# Patient Record
Sex: Male | Born: 1935 | Race: White | Hispanic: No | Smoking: Never smoker
Health system: Southern US, Community
[De-identification: ages and names within clinical notes are randomized; demographics above are authoritative.]

## PROBLEM LIST (undated history)

## (undated) DIAGNOSIS — N183 Chronic kidney disease, stage 3 unspecified: Secondary | ICD-10-CM

## (undated) DIAGNOSIS — F32A Depression, unspecified: Secondary | ICD-10-CM

## (undated) DIAGNOSIS — I10 Essential (primary) hypertension: Secondary | ICD-10-CM

## (undated) DIAGNOSIS — I509 Heart failure, unspecified: Secondary | ICD-10-CM

## (undated) DIAGNOSIS — E039 Hypothyroidism, unspecified: Secondary | ICD-10-CM

## (undated) DIAGNOSIS — E079 Disorder of thyroid, unspecified: Secondary | ICD-10-CM

## (undated) DIAGNOSIS — F329 Major depressive disorder, single episode, unspecified: Secondary | ICD-10-CM

---

## 2016-11-16 DIAGNOSIS — K219 Gastro-esophageal reflux disease without esophagitis: Secondary | ICD-10-CM | POA: Diagnosis not present

## 2016-11-16 DIAGNOSIS — R609 Edema, unspecified: Secondary | ICD-10-CM | POA: Diagnosis not present

## 2016-11-16 DIAGNOSIS — G2581 Restless legs syndrome: Secondary | ICD-10-CM | POA: Diagnosis not present

## 2016-11-16 DIAGNOSIS — E039 Hypothyroidism, unspecified: Secondary | ICD-10-CM | POA: Diagnosis not present

## 2016-11-16 DIAGNOSIS — I10 Essential (primary) hypertension: Secondary | ICD-10-CM | POA: Diagnosis not present

## 2016-11-16 DIAGNOSIS — D638 Anemia in other chronic diseases classified elsewhere: Secondary | ICD-10-CM

## 2016-11-16 DIAGNOSIS — I499 Cardiac arrhythmia, unspecified: Secondary | ICD-10-CM

## 2016-11-16 DIAGNOSIS — N189 Chronic kidney disease, unspecified: Secondary | ICD-10-CM

## 2016-11-17 DIAGNOSIS — R609 Edema, unspecified: Secondary | ICD-10-CM | POA: Diagnosis not present

## 2016-11-17 DIAGNOSIS — I10 Essential (primary) hypertension: Secondary | ICD-10-CM | POA: Diagnosis not present

## 2016-11-17 DIAGNOSIS — I499 Cardiac arrhythmia, unspecified: Secondary | ICD-10-CM | POA: Diagnosis not present

## 2016-11-17 DIAGNOSIS — D638 Anemia in other chronic diseases classified elsewhere: Secondary | ICD-10-CM | POA: Diagnosis not present

## 2016-11-17 DIAGNOSIS — G2581 Restless legs syndrome: Secondary | ICD-10-CM | POA: Diagnosis not present

## 2016-11-17 DIAGNOSIS — E039 Hypothyroidism, unspecified: Secondary | ICD-10-CM | POA: Diagnosis not present

## 2016-11-17 DIAGNOSIS — K219 Gastro-esophageal reflux disease without esophagitis: Secondary | ICD-10-CM | POA: Diagnosis not present

## 2016-11-17 DIAGNOSIS — N189 Chronic kidney disease, unspecified: Secondary | ICD-10-CM | POA: Diagnosis not present

## 2016-11-18 DIAGNOSIS — D638 Anemia in other chronic diseases classified elsewhere: Secondary | ICD-10-CM | POA: Diagnosis not present

## 2016-11-18 DIAGNOSIS — I499 Cardiac arrhythmia, unspecified: Secondary | ICD-10-CM | POA: Diagnosis not present

## 2016-11-18 DIAGNOSIS — E039 Hypothyroidism, unspecified: Secondary | ICD-10-CM | POA: Diagnosis not present

## 2016-11-18 DIAGNOSIS — N189 Chronic kidney disease, unspecified: Secondary | ICD-10-CM | POA: Diagnosis not present

## 2017-06-22 ENCOUNTER — Emergency Department (HOSPITAL_COMMUNITY): Payer: Medicare Other

## 2017-06-22 ENCOUNTER — Inpatient Hospital Stay (HOSPITAL_COMMUNITY)
Admission: EM | Admit: 2017-06-22 | Discharge: 2017-06-26 | DRG: 308 | Disposition: A | Discharge status: Skilled Nursing Facility | Payer: Medicare Other | Attending: Internal Medicine | Admitting: Internal Medicine

## 2017-06-22 ENCOUNTER — Encounter (HOSPITAL_COMMUNITY): Payer: Self-pay | Admitting: Emergency Medicine

## 2017-06-22 DIAGNOSIS — E039 Hypothyroidism, unspecified: Secondary | ICD-10-CM | POA: Diagnosis present

## 2017-06-22 DIAGNOSIS — I5032 Chronic diastolic (congestive) heart failure: Secondary | ICD-10-CM | POA: Diagnosis present

## 2017-06-22 DIAGNOSIS — T796XXA Traumatic ischemia of muscle, initial encounter: Secondary | ICD-10-CM | POA: Diagnosis present

## 2017-06-22 DIAGNOSIS — N183 Chronic kidney disease, stage 3 (moderate): Secondary | ICD-10-CM | POA: Diagnosis not present

## 2017-06-22 DIAGNOSIS — I361 Nonrheumatic tricuspid (valve) insufficiency: Secondary | ICD-10-CM | POA: Diagnosis not present

## 2017-06-22 DIAGNOSIS — Z888 Allergy status to other drugs, medicaments and biological substances status: Secondary | ICD-10-CM | POA: Diagnosis not present

## 2017-06-22 DIAGNOSIS — I10 Essential (primary) hypertension: Secondary | ICD-10-CM | POA: Diagnosis present

## 2017-06-22 DIAGNOSIS — Z66 Do not resuscitate: Secondary | ICD-10-CM | POA: Diagnosis present

## 2017-06-22 DIAGNOSIS — E860 Dehydration: Secondary | ICD-10-CM | POA: Diagnosis present

## 2017-06-22 DIAGNOSIS — Z8249 Family history of ischemic heart disease and other diseases of the circulatory system: Secondary | ICD-10-CM | POA: Diagnosis not present

## 2017-06-22 DIAGNOSIS — N179 Acute kidney failure, unspecified: Secondary | ICD-10-CM | POA: Diagnosis present

## 2017-06-22 DIAGNOSIS — R296 Repeated falls: Secondary | ICD-10-CM | POA: Diagnosis present

## 2017-06-22 DIAGNOSIS — R748 Abnormal levels of other serum enzymes: Secondary | ICD-10-CM | POA: Diagnosis not present

## 2017-06-22 DIAGNOSIS — R7401 Elevation of levels of liver transaminase levels: Secondary | ICD-10-CM

## 2017-06-22 DIAGNOSIS — F32A Depression, unspecified: Secondary | ICD-10-CM | POA: Insufficient documentation

## 2017-06-22 DIAGNOSIS — I130 Hypertensive heart and chronic kidney disease with heart failure and stage 1 through stage 4 chronic kidney disease, or unspecified chronic kidney disease: Secondary | ICD-10-CM | POA: Diagnosis present

## 2017-06-22 DIAGNOSIS — W19XXXA Unspecified fall, initial encounter: Secondary | ICD-10-CM

## 2017-06-22 DIAGNOSIS — N184 Chronic kidney disease, stage 4 (severe): Secondary | ICD-10-CM | POA: Diagnosis present

## 2017-06-22 DIAGNOSIS — R740 Nonspecific elevation of levels of transaminase and lactic acid dehydrogenase [LDH]: Secondary | ICD-10-CM

## 2017-06-22 DIAGNOSIS — R7989 Other specified abnormal findings of blood chemistry: Secondary | ICD-10-CM | POA: Diagnosis present

## 2017-06-22 DIAGNOSIS — R55 Syncope and collapse: Secondary | ICD-10-CM | POA: Diagnosis not present

## 2017-06-22 DIAGNOSIS — G9341 Metabolic encephalopathy: Secondary | ICD-10-CM

## 2017-06-22 DIAGNOSIS — Z91048 Other nonmedicinal substance allergy status: Secondary | ICD-10-CM

## 2017-06-22 DIAGNOSIS — I509 Heart failure, unspecified: Secondary | ICD-10-CM

## 2017-06-22 DIAGNOSIS — I959 Hypotension, unspecified: Secondary | ICD-10-CM | POA: Diagnosis present

## 2017-06-22 DIAGNOSIS — F329 Major depressive disorder, single episode, unspecified: Secondary | ICD-10-CM | POA: Insufficient documentation

## 2017-06-22 DIAGNOSIS — Z79890 Hormone replacement therapy (postmenopausal): Secondary | ICD-10-CM | POA: Diagnosis not present

## 2017-06-22 DIAGNOSIS — R945 Abnormal results of liver function studies: Secondary | ICD-10-CM | POA: Diagnosis not present

## 2017-06-22 DIAGNOSIS — I214 Non-ST elevation (NSTEMI) myocardial infarction: Secondary | ICD-10-CM

## 2017-06-22 DIAGNOSIS — R778 Other specified abnormalities of plasma proteins: Secondary | ICD-10-CM | POA: Diagnosis present

## 2017-06-22 DIAGNOSIS — D72829 Elevated white blood cell count, unspecified: Secondary | ICD-10-CM | POA: Diagnosis present

## 2017-06-22 DIAGNOSIS — Z7982 Long term (current) use of aspirin: Secondary | ICD-10-CM | POA: Diagnosis not present

## 2017-06-22 DIAGNOSIS — G259 Extrapyramidal and movement disorder, unspecified: Secondary | ICD-10-CM | POA: Diagnosis present

## 2017-06-22 DIAGNOSIS — R001 Bradycardia, unspecified: Secondary | ICD-10-CM | POA: Diagnosis present

## 2017-06-22 DIAGNOSIS — I4891 Unspecified atrial fibrillation: Secondary | ICD-10-CM | POA: Diagnosis present

## 2017-06-22 HISTORY — DX: Heart failure, unspecified: I50.9

## 2017-06-22 HISTORY — DX: Essential (primary) hypertension: I10

## 2017-06-22 HISTORY — DX: Disorder of thyroid, unspecified: E07.9

## 2017-06-22 HISTORY — DX: Depression, unspecified: F32.A

## 2017-06-22 HISTORY — DX: Major depressive disorder, single episode, unspecified: F32.9

## 2017-06-22 HISTORY — DX: Chronic kidney disease, stage 3 unspecified: N18.30

## 2017-06-22 HISTORY — DX: Chronic kidney disease, stage 3 (moderate): N18.3

## 2017-06-22 HISTORY — DX: Hypothyroidism, unspecified: E03.9

## 2017-06-22 LAB — URINALYSIS, ROUTINE W REFLEX MICROSCOPIC
Bilirubin Urine: NEGATIVE
Glucose, UA: NEGATIVE mg/dL
Ketones, ur: 5 mg/dL — AB
Leukocytes, UA: NEGATIVE
Nitrite: NEGATIVE
PH: 5 (ref 5.0–8.0)
Protein, ur: 100 mg/dL — AB
SPECIFIC GRAVITY, URINE: 1.018 (ref 1.005–1.030)

## 2017-06-22 LAB — BRAIN NATRIURETIC PEPTIDE: B Natriuretic Peptide: 817.9 pg/mL — ABNORMAL HIGH (ref 0.0–100.0)

## 2017-06-22 LAB — I-STAT CHEM 8, ED
BUN: 53 mg/dL — ABNORMAL HIGH (ref 6–20)
CALCIUM ION: 1.18 mmol/L (ref 1.15–1.40)
CHLORIDE: 109 mmol/L (ref 101–111)
Creatinine, Ser: 1.7 mg/dL — ABNORMAL HIGH (ref 0.61–1.24)
GLUCOSE: 126 mg/dL — AB (ref 65–99)
HCT: 34 % — ABNORMAL LOW (ref 39.0–52.0)
Hemoglobin: 11.6 g/dL — ABNORMAL LOW (ref 13.0–17.0)
Potassium: 3.8 mmol/L (ref 3.5–5.1)
SODIUM: 142 mmol/L (ref 135–145)
TCO2: 22 mmol/L (ref 22–32)

## 2017-06-22 LAB — PROTIME-INR
INR: 1.17
PROTHROMBIN TIME: 14.8 s (ref 11.4–15.2)

## 2017-06-22 LAB — CBG MONITORING, ED: Glucose-Capillary: 121 mg/dL — ABNORMAL HIGH (ref 65–99)

## 2017-06-22 LAB — COMPREHENSIVE METABOLIC PANEL
ALBUMIN: 3.2 g/dL — AB (ref 3.5–5.0)
ALK PHOS: 81 U/L (ref 38–126)
ALT: 79 U/L — ABNORMAL HIGH (ref 17–63)
ANION GAP: 11 (ref 5–15)
AST: 296 U/L — ABNORMAL HIGH (ref 15–41)
BILIRUBIN TOTAL: 1.1 mg/dL (ref 0.3–1.2)
BUN: 57 mg/dL — AB (ref 6–20)
CALCIUM: 8.7 mg/dL — AB (ref 8.9–10.3)
CO2: 21 mmol/L — ABNORMAL LOW (ref 22–32)
Chloride: 110 mmol/L (ref 101–111)
Creatinine, Ser: 1.94 mg/dL — ABNORMAL HIGH (ref 0.61–1.24)
GFR calc Af Amer: 32 mL/min — ABNORMAL LOW (ref >60)
GFR calc non Af Amer: 28 mL/min — ABNORMAL LOW (ref >60)
GLUCOSE: 124 mg/dL — AB (ref 65–99)
Potassium: 3.8 mmol/L (ref 3.5–5.1)
Sodium: 142 mmol/L (ref 135–145)
TOTAL PROTEIN: 5.6 g/dL — AB (ref 6.5–8.1)

## 2017-06-22 LAB — SODIUM, URINE, RANDOM: Sodium, Ur: 42 mmol/L

## 2017-06-22 LAB — CBC WITH DIFFERENTIAL/PLATELET
Abs Immature Granulocytes: 0.1 10*3/uL (ref 0.0–0.1)
BASOS ABS: 0 10*3/uL (ref 0.0–0.1)
BASOS PCT: 0 %
EOS PCT: 0 %
Eosinophils Absolute: 0 10*3/uL (ref 0.0–0.7)
HCT: 37.1 % — ABNORMAL LOW (ref 39.0–52.0)
Hemoglobin: 12 g/dL — ABNORMAL LOW (ref 13.0–17.0)
Immature Granulocytes: 1 %
Lymphocytes Relative: 5 %
Lymphs Abs: 0.8 10*3/uL (ref 0.7–4.0)
MCH: 30.2 pg (ref 26.0–34.0)
MCHC: 32.3 g/dL (ref 30.0–36.0)
MCV: 93.2 fL (ref 78.0–100.0)
MONO ABS: 0.8 10*3/uL (ref 0.1–1.0)
Monocytes Relative: 5 %
NEUTROS ABS: 13.7 10*3/uL — AB (ref 1.7–7.7)
Neutrophils Relative %: 89 %
PLATELETS: 259 10*3/uL (ref 150–400)
RBC: 3.98 MIL/uL — AB (ref 4.22–5.81)
RDW: 13.2 % (ref 11.5–15.5)
WBC: 15.5 10*3/uL — AB (ref 4.0–10.5)

## 2017-06-22 LAB — I-STAT TROPONIN, ED: Troponin i, poc: 0.57 ng/mL (ref 0.00–0.08)

## 2017-06-22 LAB — AMMONIA: AMMONIA: 18 umol/L (ref 9–35)

## 2017-06-22 LAB — MAGNESIUM: MAGNESIUM: 2.3 mg/dL (ref 1.7–2.4)

## 2017-06-22 LAB — TROPONIN I: Troponin I: 0.52 ng/mL (ref <0.03)

## 2017-06-22 LAB — RAPID URINE DRUG SCREEN, HOSP PERFORMED
AMPHETAMINES: NOT DETECTED
BARBITURATES: NOT DETECTED
BENZODIAZEPINES: NOT DETECTED
Cocaine: NOT DETECTED
Opiates: NOT DETECTED
TETRAHYDROCANNABINOL: NOT DETECTED

## 2017-06-22 LAB — PHOSPHORUS: Phosphorus: 3.9 mg/dL (ref 2.5–4.6)

## 2017-06-22 LAB — CREATININE, URINE, RANDOM: CREATININE, URINE: 163.79 mg/dL

## 2017-06-22 LAB — ACETAMINOPHEN LEVEL

## 2017-06-22 MED ORDER — ROPINIROLE HCL 1 MG PO TABS
8.0000 mg | ORAL_TABLET | Freq: Two times a day (BID) | ORAL | Status: DC
  Filled 2017-06-22 (×2): qty 8

## 2017-06-22 MED ORDER — ASPIRIN 300 MG RE SUPP
600.0000 mg | Freq: Once | RECTAL | Status: AC
  Administered 2017-06-22: 600 mg via RECTAL
  Filled 2017-06-22: qty 2

## 2017-06-22 MED ORDER — FINASTERIDE 5 MG PO TABS
5.0000 mg | ORAL_TABLET | Freq: Every day | ORAL | Status: DC
  Administered 2017-06-23 – 2017-06-26 (×4): 5 mg via ORAL
  Filled 2017-06-22 (×4): qty 1

## 2017-06-22 MED ORDER — ATROPINE SULFATE 1 MG/10ML IJ SOSY
PREFILLED_SYRINGE | INTRAMUSCULAR | Status: AC
  Filled 2017-06-22: qty 10

## 2017-06-22 MED ORDER — ADULT MULTIVITAMIN W/MINERALS CH
1.0000 | ORAL_TABLET | Freq: Every day | ORAL | Status: DC
  Administered 2017-06-23 – 2017-06-26 (×4): 1 via ORAL
  Filled 2017-06-22 (×4): qty 1

## 2017-06-22 MED ORDER — CINNAMON 500 MG PO CAPS: 500.0000 mg | ORAL_CAPSULE | Freq: Every day | ORAL | Status: DC

## 2017-06-22 MED ORDER — ZOLPIDEM TARTRATE 5 MG PO TABS: 5.0000 mg | ORAL_TABLET | Freq: Every evening | ORAL | Status: DC | PRN

## 2017-06-22 MED ORDER — CENTRUM SILVER 50+MEN PO TABS: 1.0000 | ORAL_TABLET | Freq: Every day | ORAL | Status: DC

## 2017-06-22 MED ORDER — ASPIRIN 81 MG PO CHEW
324.0000 mg | CHEWABLE_TABLET | Freq: Once | ORAL | Status: DC
  Filled 2017-06-22: qty 4

## 2017-06-22 MED ORDER — SODIUM CHLORIDE 0.9 % IV BOLUS
500.0000 mL | Freq: Once | INTRAVENOUS | Status: AC
  Administered 2017-06-22: 500 mL via INTRAVENOUS

## 2017-06-22 MED ORDER — ONDANSETRON HCL 4 MG/2ML IJ SOLN: 4.0000 mg | Freq: Three times a day (TID) | INTRAMUSCULAR | Status: DC | PRN

## 2017-06-22 MED ORDER — SODIUM CHLORIDE 0.9 % IV SOLN
INTRAVENOUS | Status: DC
  Administered 2017-06-23 – 2017-06-24 (×3): via INTRAVENOUS

## 2017-06-22 MED ORDER — ENOXAPARIN SODIUM 30 MG/0.3ML ~~LOC~~ SOLN
30.0000 mg | Freq: Every day | SUBCUTANEOUS | Status: DC
  Administered 2017-06-23 – 2017-06-25 (×3): 30 mg via SUBCUTANEOUS
  Filled 2017-06-22 (×4): qty 0.3

## 2017-06-22 MED ORDER — ATROPINE SULFATE 1 MG/10ML IJ SOSY: 0.5000 mg | PREFILLED_SYRINGE | INTRAMUSCULAR | Status: DC | PRN

## 2017-06-22 MED ORDER — LEVOTHYROXINE SODIUM 75 MCG PO TABS
75.0000 ug | ORAL_TABLET | Freq: Every day | ORAL | Status: DC
  Administered 2017-06-23 – 2017-06-24 (×2): 75 ug via ORAL
  Filled 2017-06-22 (×3): qty 1

## 2017-06-22 MED ORDER — NITROGLYCERIN 0.4 MG SL SUBL: 0.4000 mg | SUBLINGUAL_TABLET | SUBLINGUAL | Status: DC | PRN

## 2017-06-22 MED ORDER — MORPHINE SULFATE (PF) 4 MG/ML IV SOLN: 1.0000 mg | INTRAVENOUS | Status: DC | PRN

## 2017-06-22 MED ORDER — ASPIRIN 81 MG PO CHEW
324.0000 mg | CHEWABLE_TABLET | Freq: Every day | ORAL | Status: DC
  Filled 2017-06-22: qty 4

## 2017-06-22 MED ORDER — HYDRALAZINE HCL 20 MG/ML IJ SOLN
5.0000 mg | INTRAMUSCULAR | Status: DC | PRN
  Administered 2017-06-25 (×2): 5 mg via INTRAVENOUS
  Filled 2017-06-22 (×2): qty 1

## 2017-06-22 NOTE — ED Notes (Signed)
Patient taken to CT on telemetry

## 2017-06-22 NOTE — ED Notes (Signed)
RN attempted to call Report. RN unavailable

## 2017-06-22 NOTE — Consult Note (Addendum)
Cardiology Consultation:   Patient ID: Gregory Mcpherson; [id number]; [birth year]   Admit date: 06/22/2017 Date of Consult: 06/22/2017  Primary Care Provider: Gordan Mcpherson., MD Primary Cardiologist: Gregory Alexander, MD   -new   Patient Profile:   Gregory Mcpherson is a 84 y.o. male with a hx of hypertension, CKD, HFpEF, BPH, hypothyroidism, GERD, HLD who is being seen today for the evaluation of bradycardia at the request of Dr. Rhunette Mcpherson.  History of Present Illness:   Mr. Gregory Mcpherson lives in assisted living. His family noted that he did not answer his door this morning and he was found on the floor near the shower, presumably fell. He had a fall last week as well suspected to be related to his r knee osteoarthritis. He refused transport to the hospital at that time. His son came from out of town and noted that the patient was more altered than usual, slower to respond and seemed confused.   EMS was called and found the patient to have bradycardia with HR in the 30's and BP 80-40. He was given atropine 0.5 mg in transport with HR up to 66 and BP up to 90/60. He was given NS 700 ml IV.   Mr. Gregory Mcpherson has no known cardiac history or known arrhythmias. He was noted to have increased edema in around 11/2016 and was hospitalized at [facility]. An echocardiogram showed his ejection fraction 60-65% with diastolic dysfunction. His symptoms were felt to be related to uncontrolled hypertension.   He is a non-smoker  No past medical history on file.   Home Medications:  Prior to Admission medications   Medication Sig Start Date End Date Taking? Authorizing Provider  aspirin 81 MG chewable tablet Chew 81 mg by mouth daily.    Yes [provider]  Cinnamon 500 MG capsule Take 500 mg by mouth daily.   Yes [provider]  cloNIDine (CATAPRES) 0.1 MG tablet Take 0.1 mg by mouth daily as needed (if SB/P>170 as clinically directed).  05/14/16  Yes [provider]  finasteride (PROSCAR) 5 MG  tablet Take 5 mg by mouth daily. 06/01/17  Yes [provider]  levothyroxine (SYNTHROID, LEVOTHROID) 75 MCG tablet Take 75 mcg by mouth daily. 06/01/17  Yes [provider]  Multiple Vitamins-Minerals (CENTRUM SILVER 50+MEN) TABS Take 1 tablet by mouth daily.   Yes [provider]  rOPINIRole (REQUIP) 4 MG tablet Take 8 mg by mouth 2 (two) times daily. 06/20/17  Yes [provider]  telmisartan (MICARDIS) 40 MG tablet Take 40 mg by mouth daily. 06/01/17  Yes [provider]    Inpatient Medications: Scheduled Meds: . aspirin  324 mg Oral Once  . atropine       Continuous Infusions:  PRN Meds:   Allergies:    Allergies  Allergen Reactions  . Pravastatin Sodium Other (See Comments)    Muscle aches  . Tape Other (See Comments)    Skin is very thin and delicate; PLEASE USE AN ALTERNATIVE TO TAPE!!!  . Niacin Rash    Social History:   Social History   Socioeconomic History  . Marital status: Widowed    Spouse name: Not on file  . Number of children: Not on file  . Years of education: Not on file  . Highest education level: Not on file  Occupational History  . Not on file  Social Needs  . Financial resource strain: Not on file  . Food insecurity:    Worry: Not on file  Inability: Not on file  . Transportation needs:    Medical: Not on file    Non-medical: Not on file  Tobacco Use  . Smoking status: Not on file  Substance and Sexual Activity  . Alcohol use: Not on file  . Drug use: Not on file  . Sexual activity: Not on file  Lifestyle  . Physical activity:    Days per week: Not on file    Minutes per session: Not on file  . Stress: Not on file  Relationships  . Social connections:    Talks on phone: Not on file    Gets together: Not on file    Attends religious service: Not on file    Active member of club or organization: Not on file    Attends meetings of clubs or organizations: Not on file    Relationship status:  Not on file  . Intimate partner violence:    Fear of current or ex partner: Not on file    Emotionally abused: Not on file    Physically abused: Not on file    Forced sexual activity: Not on file  Other Topics Concern  . Not on file  Social History Narrative  . Not on file    Family History:   No family history on file.   ROS:  Please see the history of present illness.   All other ROS reviewed and negative.     Physical Exam/Data:   Vitals:   06/22/17 1748 06/22/17 1750  BP: 128/86   Pulse: (!) 54   Resp: 16   Temp: (!) 97.1 F (36.2 C)   TempSrc: Oral   Weight:  160 lb (72.6 kg)  Height:  5\' 10"  (1.778 m)   No intake or output data in the 24 hours ending 06/22/17 1836 Filed Weights   06/22/17 1750  Weight: 160 lb (72.6 kg)   Body mass index is 22.96 kg/m.   EKG:  The EKG was personally reviewed and demonstrates:  ** Telemetry:  Telemetry was personally reviewed and demonstrates:  **  Relevant CV Studies: None  Laboratory Data:  Chemistry Recent Labs  Lab 06/22/17 1759  NA 142  K 3.8  CL 109  GLUCOSE 126*  BUN 53*  CREATININE 1.70*    No results for input(s): PROT, ALBUMIN, AST, ALT, ALKPHOS, BILITOT in the last 168 hours. Hematology Recent Labs  Lab 06/22/17 1754 06/22/17 1759  WBC 15.5*  --   RBC 3.98*  --   HGB 12.0* 11.6*  HCT 37.1* 34.0*  MCV 93.2  --   MCH 30.2  --   MCHC 32.3  --   RDW 13.2  --   PLT 259  --    Cardiac EnzymesNo results for input(s): TROPONINI in the last 168 hours.  Recent Labs  Lab 06/22/17 1756  TROPIPOC 0.57*    BNPNo results for input(s): BNP, PROBNP in the last 168 hours.  DDimer No results for input(s): DDIMER in the last 168 hours.  Radiology/Studies:  Dg Chest Port 1 View  Result Date: 06/22/2017 CLINICAL DATA:  Bradycardia EXAM: PORTABLE CHEST 1 VIEW COMPARISON:  None. FINDINGS: Heart size upper normal. Negative for heart failure. Atherosclerotic aortic arch. Lungs are clear without infiltrate  effusion or edema. IMPRESSION: No active disease. Electronically Signed   By: Marlan Palau M.D.   On: 06/22/2017 18:15    Assessment and Plan:   Bradycardia -Pt with syncopal episode with bradycardia and hypotension.   Hypertension  -Home  meds include micardis 40 mg daily and clonidine 0.1 mg prn.   Chronic diastolic HF -[facility]   For questions or updates, please contact [facility] Please consult [email or web address] for contact info under Cardiology/STEMI.   Signed, Berton BonJanine Hammond, NP  06/22/2017 6:36 PM  The patient was seen, examined and discussed with Berton BonJanine Hammond, NP-C and I agree with the above.   84 year old patient who lives independently in assisted living, previously followed for hypertension by his primary care physician, who was brought to the ER after a fall this morning.  Per family patient has been struggling with balance, this has been happening for almost a year, he has an old knee injury that was never repaired and family attribute it his poor balance to knee giving out on the patient.  I also found in a note from his primary care physician that he has difficult to control hypertension with medication resulting in hypotension dizziness and lightheadedness.  Per son patient fell several times in the last couple of months.  He does not have any history of coronary artery disease heart failure or any arrhythmias.  No family history of sudden cardiac death or coronary artery disease, his mother lived till [id number].  This morning the patient was not opening the door, EMS was called and found patient in front of shower, the patient does not remember this episode does not remember what happened before prior does not know if he lost consciousness and does not know if he hit his head.  He states that currently he is comfortable and does not experience pain anywhere.  On physical exam patient appears elderly, with soft voice no obvious asymmetry in his facial  expression, no focal weakness in his upper or lower extremities, no JVDs, no obvious murmur, clear lungs, no lower extremity edema and good peripheral pulses.  On arrival to the ED patient was found to have low blood pressure and was given bolus of normal saline.  He was also found to be in atrial fibrillation with slow ventricular response and ventricular rates anywhere from 38-65.  There are no pauses longer than 3 seconds on telemetry.  Labs show creatinine of 1.9, normal potassium and sodium, elevated LFTs, normal hemoglobin and platelets, troponin of 0.57  Plan: -The patient will be admitted to hospitalist for stroke work-up, intracranial bleed needs to be ruled out -We will obtain an echocardiogram to evaluate LVEF -We will monitor telemetry closely, while A. fib with slow ventricular response might be responsible for syncopal episode at this point it is not clear what caused it -The patient is a poor anticoagulation candidate given multiple recent falls, also he has not had his head CT to rule out intracranial bleed yet , therefore we will not start anticoagulation right now -He was not on any AV nodal blocking agent, we will check TSH -His troponin is elevated, however with CKD stage IV he is a poor Candidate also given his age, mild memory impairment and DNR status. -Unclear what the etiology of elevated LFTs  Gregory AlexanderKatarina Amberley Hamler, MD 06/22/2017

## 2017-06-22 NOTE — ED Notes (Signed)
Pt noted as maintaining slow HR. Dr. Pricilla Holmucker to bedside.

## 2017-06-22 NOTE — ED Notes (Signed)
Lab Tech Clydie BraunKaren report Trop 0.52

## 2017-06-22 NOTE — ED Notes (Signed)
MDS at bedside.

## 2017-06-22 NOTE — ED Notes (Signed)
Elevated I-stat trop reported to Dr. Rhunette CroftNanavati

## 2017-06-22 NOTE — ED Notes (Signed)
Pt returns from ct where he was accompanied by RN with zoll.

## 2017-06-22 NOTE — H&P (Signed)
History and Physical    Gregory Mcpherson ZHY:[id number] DOB: Jun 06, 1920 DOA: 06/22/2017  Referring MD/NP/PA:   PCP: Gordan Payment., MD   Patient coming from:  The patient is coming from [facility].  At baseline, pt is dpendent for most of ADL.   Chief Complaint: Fall, altered mental status  HPI: Gregory Mcpherson is a 84 y.o. male with medical history significant of hypertension, dCHF, hypothyroidism, depression, CKD-3, depression, who presents with fall, AMS.  Per his son, pt was found on the floor near shower in the facility this AM,  presumably fell. He refused transport to the hospital at that time. His son states that he probably did not pass out. Per his son, he has right knee arthritis and chronic right knee weakness. Pt had fall last week, which was suspected to be related to his right knee osteoarthritis. His son came from out of town and noted that the patient was is slower and mildly confused. Per report, EMS found the patient to have bradycardia with HR in the 30's and BP 80-40. He was given atropine 0.5 mg in transport with HR up to 66 and BP up to 90/60. He was given NS 700 ml IV. Pt denies chest pain, shortness breath, cough, fever or chills.  No nausea, vomiting, diarrhea, abdominal pain, symptoms of UTI. Pt has mild slurred speech which his son attributes to pt's dry mouth.  Denies unilateral weakness or numbness in extremities, no facial droop. Pt was found to have new onset A fib with slow ventricular response and bradycardia in ED.  ED Course: pt was found to have trop 0.57, WBC 15.5, INR 1.17, abnormal liver function with AST 296, ALT 79, total bilirubin 1.1, ALP 81, tylenol level less than 10, negative urinalysis, worsening renal function, temperature normal, tachypnea, oxygen saturation 99%, negative chest x-ray.  CT head is negative for acute intracranial abnormalities, CT of C-spine showed degenerative change, without bony fracture.  Patient is admitted to telemetry bed as inpatient.   Cardiology, Dr. Delton See was consulted.  Review of Systems:   General: no fevers, chills, has fatigue HEENT: no blurry vision, hearing changes or sore throat Respiratory: no dyspnea, coughing, wheezing CV: no chest pain, no palpitations GI: no nausea, vomiting, abdominal pain, diarrhea, constipation GU: no dysuria, burning on urination, increased urinary frequency, hematuria  Ext: has leg edema Neuro: no unilateral weakness, numbness, or tingling, no vision change or hearing loss. Had fall. Skin: no rash, no skin tear. MSK: No muscle spasm, no deformity, no limitation of range of movement in spin.  Has right knee pain and weakness. Heme: No easy bruising.  Travel history: No recent long distant travel.  Allergy:  Allergies  Allergen Reactions  . Pravastatin Sodium Other (See Comments)    Muscle aches  . Tape Other (See Comments)    Skin is very thin and delicate; PLEASE USE AN ALTERNATIVE TO TAPE!!!  . Niacin Rash    Past Medical History:  Diagnosis Date  . CHF (congestive heart failure) (HCC)   . CKD (chronic kidney disease), stage III (HCC)   . Depression   . Hypertension   . Hypothyroidism   . Thyroid disease     History reviewed. No pertinent surgical history.  Social History:  reports that he has never smoked. He has never used smokeless tobacco. He reports that he drank alcohol. He reports that he has current or past drug history.  Family History:  Family History  Problem Relation Age of Onset  . Hypertension  Mother   . Heart disease Father      Prior to Admission medications   Medication Sig Start Date End Date Taking? Authorizing Provider  aspirin 81 MG chewable tablet Chew 81 mg by mouth daily.    Yes [provider]  Cinnamon 500 MG capsule Take 500 mg by mouth daily.   Yes [provider]  cloNIDine (CATAPRES) 0.1 MG tablet Take 0.1 mg by mouth daily as needed (if SB/P>170 as clinically directed).  05/14/16  Yes [provider]    finasteride (PROSCAR) 5 MG tablet Take 5 mg by mouth daily. 06/01/17  Yes [provider]  levothyroxine (SYNTHROID, LEVOTHROID) 75 MCG tablet Take 75 mcg by mouth daily. 06/01/17  Yes [provider]  Multiple Vitamins-Minerals (CENTRUM SILVER 50+MEN) TABS Take 1 tablet by mouth daily.   Yes [provider]  rOPINIRole (REQUIP) 4 MG tablet Take 8 mg by mouth 2 (two) times daily. 06/20/17  Yes [provider]  telmisartan (MICARDIS) 40 MG tablet Take 40 mg by mouth daily. 06/01/17  Yes [provider]    Physical Exam: Vitals:   06/22/17 2115 06/22/17 2130 06/22/17 2145 06/22/17 2200  BP:    110/83  Pulse: (!) 55 99 (!) 58 (!) 56  Resp: (!) 35 16 18 19   Temp:      TempSrc:      SpO2: 97% 97% 99% 99%  Weight:      Height:       General: Not in acute distress HEENT:       Eyes: PERRL, EOMI, no scleral icterus.       ENT: No discharge from the ears and nose, no pharynx injection, no tonsillar enlargement.        Neck: No JVD, no bruit, no mass felt. Heme: No neck lymph node enlargement. Cardiac: S1/S2, RRR, No murmurs, No gallops or rubs. Respiratory: Good air movement bilaterally. No rales, wheezing, rhonchi or rubs. GI: Soft, nondistended, nontender, no rebound pain, no organomegaly, BS present. GU: No hematuria Ext: No pitting leg edema bilaterally. 2+DP/PT pulse bilaterally. Musculoskeletal: No joint deformities, No joint redness or warmth, no limitation of ROM in spin. Skin: No rashes.  Neuro: mildly confused, but oriented X3, cranial nerves II-XII grossly intact. Muscle strength 4/5 in all extremities, sensation to light touch intact. Brachial reflex 2+ bilaterally. Negative Babinski's sign. Psych: Patient is not psychotic, no suicidal or hemocidal ideation.  Labs on Admission: I have personally reviewed following labs and imaging studies  CBC: Recent Labs  Lab 06/22/17 1754 06/22/17 1759  WBC 15.5*  --   NEUTROABS 13.7*  --    HGB 12.0* 11.6*  HCT 37.1* 34.0*  MCV 93.2  --   PLT 259  --    Basic Metabolic Panel: Recent Labs  Lab 06/22/17 1754 06/22/17 1759  NA 142 142  K 3.8 3.8  CL 110 109  CO2 21*  --   GLUCOSE 124* 126*  BUN 57* 53*  CREATININE 1.94* 1.70*  CALCIUM 8.7*  --   MG 2.3  --   PHOS 3.9  --    GFR: Estimated Creatinine Clearance: 27.3 mL/min (A) (by C-G formula based on SCr of 1.7 mg/dL (H)). Liver Function Tests: Recent Labs  Lab 06/22/17 1754  AST 296*  ALT 79*  ALKPHOS 81  BILITOT 1.1  PROT 5.6*  ALBUMIN 3.2*   No results for input(s): LIPASE, AMYLASE in the last 168 hours. Recent Labs  Lab 06/22/17 2124  AMMONIA  18   Coagulation Profile: Recent Labs  Lab 06/22/17 1754  INR 1.17   Cardiac Enzymes: Recent Labs  Lab 06/22/17 2124  TROPONINI 0.52*   BNP (last 3 results) No results for input(s): PROBNP in the last 8760 hours. HbA1C: No results for input(s): HGBA1C in the last 72 hours. CBG: Recent Labs  Lab 06/22/17 1743  GLUCAP 121*   Lipid Profile: No results for input(s): CHOL, HDL, LDLCALC, TRIG, CHOLHDL, LDLDIRECT in the last 72 hours. Thyroid Function Tests: No results for input(s): TSH, T4TOTAL, FREET4, T3FREE, THYROIDAB in the last 72 hours. Anemia Panel: No results for input(s): VITAMINB12, FOLATE, FERRITIN, TIBC, IRON, RETICCTPCT in the last 72 hours. Urine analysis:    Component Value Date/Time   COLORURINE YELLOW 06/22/2017 2202   APPEARANCEUR HAZY (A) 06/22/2017 2202   LABSPEC 1.018 06/22/2017 2202   PHURINE 5.0 06/22/2017 2202   GLUCOSEU NEGATIVE 06/22/2017 2202   HGBUR LARGE (A) 06/22/2017 2202   BILIRUBINUR NEGATIVE 06/22/2017 2202   KETONESUR 5 (A) 06/22/2017 2202   PROTEINUR 100 (A) 06/22/2017 2202   NITRITE NEGATIVE 06/22/2017 2202   LEUKOCYTESUR NEGATIVE 06/22/2017 2202   Sepsis Labs: @LABRCNTIP (procalcitonin:4,lacticidven:4) )No results found for this or any previous visit (from the past 240 hour(s)).   Radiological  Exams on Admission: Ct Head Wo Contrast  Result Date: 06/22/2017 CLINICAL DATA:  Recent fall, altered mental status, hypertension, trauma EXAM: CT HEAD WITHOUT CONTRAST CT CERVICAL SPINE WITHOUT CONTRAST TECHNIQUE: Multidetector CT imaging of the head and cervical spine was performed following the standard protocol without intravenous contrast. Multiplanar CT image reconstructions of the cervical spine were also generated. COMPARISON:  None. FINDINGS: CT HEAD FINDINGS Brain: Brain atrophy noted with chronic white matter microvascular changes. Remote left parietal infarct with encephalomalacia. No acute intracranial hemorrhage, new mass lesion, new infarction, midline shift, herniation, hydrocephalus. No extra-axial fluid collection. Cisterns are patent. Cerebellar atrophy as well. Vascular: Intracranial atherosclerosis.  No hyperdense vessel. Skull: Normal. Negative for fracture or focal lesion. Sinuses/Orbits: Orbits are symmetric. Minor polypoid mucosal thickening in the right maxillary sinus. No sinus air-fluid level. Other: None. CT CERVICAL SPINE FINDINGS Alignment: Normal. Skull base and vertebrae: No acute fracture. No primary bone lesion or focal pathologic process. Soft tissues and spinal canal: No prevertebral fluid or swelling. No visible canal hematoma. Disc levels: Cervical degenerative spondylosis at all levels with disc space narrowing, sclerosis and osteophytes. C4-5 and C5-6 are most severe. Multilevel facet arthropathy, worse on the right. No subluxation dislocation. Normal prevertebral soft tissues. Degenerative changes of the C1-2 articulation. Upper chest: Negative. Other: None. IMPRESSION: No acute intracranial abnormality by noncontrast CT. Brain atrophy and chronic white matter microvascular changes with remote left parietal infarct noted. Cervical degenerative spondylosis without acute osseous finding, fracture or malalignment. Electronically Signed   By: Judie PetitM.  Shick M.D.   On: 06/22/2017  19:48   Ct Cervical Spine Wo Contrast  Result Date: 06/22/2017 CLINICAL DATA:  Recent fall, altered mental status, hypertension, trauma EXAM: CT HEAD WITHOUT CONTRAST CT CERVICAL SPINE WITHOUT CONTRAST TECHNIQUE: Multidetector CT imaging of the head and cervical spine was performed following the standard protocol without intravenous contrast. Multiplanar CT image reconstructions of the cervical spine were also generated. COMPARISON:  None. FINDINGS: CT HEAD FINDINGS Brain: Brain atrophy noted with chronic white matter microvascular changes. Remote left parietal infarct with encephalomalacia. No acute intracranial hemorrhage, new mass lesion, new infarction, midline shift, herniation, hydrocephalus. No extra-axial fluid collection. Cisterns are patent. Cerebellar atrophy as well. Vascular: Intracranial atherosclerosis.  No hyperdense vessel. Skull: Normal. Negative for fracture or focal lesion. Sinuses/Orbits: Orbits are symmetric. Minor polypoid mucosal thickening in the right maxillary sinus. No sinus air-fluid level. Other: None. CT CERVICAL SPINE FINDINGS Alignment: Normal. Skull base and vertebrae: No acute fracture. No primary bone lesion or focal pathologic process. Soft tissues and spinal canal: No prevertebral fluid or swelling. No visible canal hematoma. Disc levels: Cervical degenerative spondylosis at all levels with disc space narrowing, sclerosis and osteophytes. C4-5 and C5-6 are most severe. Multilevel facet arthropathy, worse on the right. No subluxation dislocation. Normal prevertebral soft tissues. Degenerative changes of the C1-2 articulation. Upper chest: Negative. Other: None. IMPRESSION: No acute intracranial abnormality by noncontrast CT. Brain atrophy and chronic white matter microvascular changes with remote left parietal infarct noted. Cervical degenerative spondylosis without acute osseous finding, fracture or malalignment. Electronically Signed   By: Judie Petit.  Shick M.D.   On: 06/22/2017  19:48   Dg Chest Port 1 View  Result Date: 06/22/2017 CLINICAL DATA:  Bradycardia EXAM: PORTABLE CHEST 1 VIEW COMPARISON:  None. FINDINGS: Heart size upper normal. Negative for heart failure. Atherosclerotic aortic arch. Lungs are clear without infiltrate effusion or edema. IMPRESSION: No active disease. Electronically Signed   By: Marlan Palau M.D.   On: 06/22/2017 18:15     EKG: Independently reviewed.  Atrial fibrillation, bradycardia, QTC 444, early R wave progression, nonspecific T wave change.   Assessment/Plan Principal Problem:   Acute metabolic encephalopathy Active Problems:   Chronic diastolic CHF (congestive heart failure) (HCC)   Hypertension   Acute renal failure superimposed on stage 3 chronic kidney disease (HCC)   Fall   New onset atrial fibrillation (HCC)   Elevated troponin   Hypotension   Bradycardia   Leukocytosis   Abnormal LFTs  Acute metabolic encephalopathy and fall: Etiology is not clear.  CT head is negative.  CT of C-spine did not show bony fracture. UA negative. Pt has mild slurred speech, but not other focal neurologic findings on physical examination. I wanted to r/o stroke by doing  MRI of brain and discussed this with his son. His son dose not think pt has stroke, dose not want to do MRI now. Will hold off MRI, and observe pt closely. -pt/Ot -swallowing screen.  -ASA. -check ammonia level -Frequent neuro check  Fall: Likely multifactorial etiology, including altered mental status, right knee arthritis, worsening renal function, hypotension and bradycardia.  CT of head and CT of C-spine no acute issues. -PT/OT  Elevated troponin: trop 0.57. No CP. Card, Dr. Delton See was consulted-->poor candidate for cath due to CKD and mild memory impairment and DNR status. - cycle CE q6 x3 and repeat EKG in the am  - prn Nitroglycerin, Morphine, and aspirin - Risk factor stratification: will check FLP and A1C  - 2d echo  New onset atrial fibrillation with  slow ventricular response and bradycardia: Atrial Fibrillation: CHA2DS2-VASc Score is at least 4, needs oral anticoagulation, but given patient's old age, mild memory impairment and frequent fall. He is a poor anticoagulation candidate. will not start AC. Pt has low ventricular response with bradycardia. -tele monitoring -Prn atropine for traumatic bradycardia -check TSH -F/u trop x 3 and 2d echo. -on ASA   Chronic diastolic CHF (congestive heart failure) [facility]): Patient does not have leg edema or JVD.  No shortness of breath.  CHF is compensated.  Patient is not taking diuretics at home. -Continue aspirin - Check BnP  Essential hypertension: Presented with hypotension which has resolved. -IV Hydralazine  prn -hold home medications due to hypotension  AoCKD-III: Baseline Cre is 1.29 on 06/15/17, pt's Cre is 1.72 and BUN 53 on admission. Likely due to prerenal secondary to dehydration and continuation of ARB. ATN is also possible due to hypotension - IVF: 500 cc NS bolus and then 50 cc/h - Follow up renal function by BMP - Check FeNa  - Hold Micarids  Leukocytosis: no signs of infection. UA and negative both negative. Likely due to stress induced to demargination -will follow up blood and urine culture -follow up by CBC  Abnormal LFTs: Etiology is not clear.  Tylenol level less than 10. -Check hepatitis panel -Avoid Tylenol    DVT ppx: SQ Lovenox Code Status: DNR (pt has DNR form from facility, which is confirmed by his son). Family Communication:  Yes, patient's son at bed side Disposition Plan:  Anticipate discharge back to previous home environment Consults called:  Card, Dr. Delton See Admission status:  Inpatient/tele     Date of Service 06/23/2017    Lorretta Harp [facility] Pager [phone]  If 7PM-7AM, please contact night-coverage [email or web address] Password [facility] 06/23/2017, 12:29 AM

## 2017-06-22 NOTE — ED Provider Notes (Signed)
[facility] Provider Note   CSN: [id number] Arrival date & time: 06/22/17  1737   History   Chief Complaint Chief Complaint  Patient presents with  . Bradycardia  . Altered Mental Status    HPI Benetta SparClarence Dejoy is a 84 y.o. male.  The history is provided by the patient and the EMS personnel. The history is limited by the condition of the patient.   84 yo M with PMHx HTN, hypothyroidism, CKD, HFpEF who presents via EMS for altered mental status, noticed by family today. Family states this morning patient didn't answer the door to his assisted living. He was found to be laying on the floor near the shower, presumably fell. Larey SeatFell last week as well, suspected related to his R knee osteoarthritis. He refused transport at this time. Son came from out of town to see him and noticed he was more altered that normal. Typically A&O x 2 (but does often know date), quick to respond, son noted today he was slower to respond and seemed confusion. Pt told son early, "I just don't feel right." No recent complaints of CP/SOB. No recent fever. Son states pt will occasionally order over the counter medications from [employer] to help with fluid retention. No h/o cardiac disease or arrhythmias.   Initially bradycardic to 34 on EMS arrival with Bps 80s/40s. Responded well to 0.5mg  atropine, HR improved to 50s with improvement in BP to 120s.   Past Medical History:  Diagnosis Date  . CHF (congestive heart failure) (HCC)   . CKD (chronic kidney disease), stage III (HCC)   . Depression   . Hypertension   . Hypothyroidism   . Thyroid disease     Patient Active Problem List   Diagnosis Date Noted  . Acute renal failure superimposed on stage 3 chronic kidney disease (HCC) 06/22/2017  . Acute metabolic encephalopathy 06/22/2017  . Fall 06/22/2017  . New onset atrial fibrillation (HCC) 06/22/2017  . Elevated troponin 06/22/2017  . Hypotension 06/22/2017  .  Bradycardia 06/22/2017  . Leukocytosis 06/22/2017  . Abnormal LFTs 06/22/2017  . Chronic diastolic CHF (congestive heart failure) (HCC)   . Hypertension   . Hypothyroidism   . Depression     History reviewed. No pertinent surgical history.     Home Medications    Prior to Admission medications   Medication Sig Start Date End Date Taking? Authorizing Provider  aspirin 81 MG chewable tablet Chew 81 mg by mouth daily.    Yes [provider]  Cinnamon 500 MG capsule Take 500 mg by mouth daily.   Yes [provider]  cloNIDine (CATAPRES) 0.1 MG tablet Take 0.1 mg by mouth daily as needed (if SB/P>170 as clinically directed).  05/14/16  Yes [provider]  finasteride (PROSCAR) 5 MG tablet Take 5 mg by mouth daily. 06/01/17  Yes [provider]  levothyroxine (SYNTHROID, LEVOTHROID) 75 MCG tablet Take 75 mcg by mouth daily. 06/01/17  Yes [provider]  Multiple Vitamins-Minerals (CENTRUM SILVER 50+MEN) TABS Take 1 tablet by mouth daily.   Yes [provider]  rOPINIRole (REQUIP) 4 MG tablet Take 8 mg by mouth 2 (two) times daily. 06/20/17  Yes [provider]  telmisartan (MICARDIS) 40 MG tablet Take 40 mg by mouth daily. 06/01/17  Yes [provider]    Family History Family History  Problem Relation Age of Onset  . Hypertension Mother   . Heart disease Father     Social History  Social History   Tobacco Use  . Smoking status: Never Smoker  . Smokeless tobacco: Never Used  Substance Use Topics  . Alcohol use: Not Currently  . Drug use: Not Currently     Allergies   Pravastatin sodium; Tape; and Niacin   Review of Systems Review of Systems  Constitutional: Negative for chills and fever.  HENT: Negative for ear pain and sore throat.   Eyes: Negative for pain.  Respiratory: Negative for shortness of breath.   Cardiovascular: Negative for chest pain and palpitations.  Gastrointestinal: Negative for  abdominal pain and vomiting.  Musculoskeletal: Negative for arthralgias and back pain.  Skin: Negative for color change and rash.  Neurological: Negative for facial asymmetry and headaches.  Psychiatric/Behavioral: Positive for confusion.  All other systems reviewed and are negative.    Physical Exam Updated Vital Signs BP 110/83   Pulse (!) 56   Temp (!) 96.7 F (35.9 C)   Resp 19   Ht 5\' 10"  (1.778 m)   Wt 72.6 kg (160 lb)   SpO2 99%   BMI 22.96 kg/m   Physical Exam  Constitutional: He appears well-developed.  HENT:  Head: Normocephalic and atraumatic.  Mouth/Throat: Oropharynx is clear and moist.  Eyes: Pupils are equal, round, and reactive to light. Conjunctivae are normal.  Neck: Neck supple.  No midline cervical spine tenderness, no step offs or deformities  Cardiovascular: Intact distal pulses. An irregularly irregular rhythm present. Bradycardia present. Exam reveals no friction rub.  No murmur heard. Pulmonary/Chest: Effort normal and breath sounds normal. No stridor. No respiratory distress. He exhibits no tenderness.  Abdominal: Soft. He exhibits no distension. There is no tenderness. There is no rebound and no guarding.  Musculoskeletal: He exhibits no edema.  Neurological: He is alert.  Alert and oriented x 2, follows all commands, no facial asymmetry, bilateral equal grip strength, BLE with symmetric strength  Skin: Skin is warm and dry.  Psychiatric: He has a normal mood and affect.  Nursing note and vitals reviewed.    ED Treatments / Results  Labs (all labs ordered are listed, but only abnormal results are displayed) Labs Reviewed  COMPREHENSIVE METABOLIC PANEL - Abnormal; Notable for the following components:      Result Value   CO2 21 (*)    Glucose, Bld 124 (*)    BUN 57 (*)    Creatinine, Ser 1.94 (*)    Calcium 8.7 (*)    Total Protein 5.6 (*)    Albumin 3.2 (*)    AST 296 (*)    ALT 79 (*)    GFR calc non Af Amer 28 (*)    GFR calc Af  Amer 32 (*)    All other components within normal limits  CBC WITH DIFFERENTIAL/PLATELET - Abnormal; Notable for the following components:   WBC 15.5 (*)    RBC 3.98 (*)    Hemoglobin 12.0 (*)    HCT 37.1 (*)    Neutro Abs 13.7 (*)    All other components within normal limits  TROPONIN I - Abnormal; Notable for the following components:   Troponin I 0.52 (*)    All other components within normal limits  URINALYSIS, ROUTINE W REFLEX MICROSCOPIC - Abnormal; Notable for the following components:   APPearance HAZY (*)    Hgb urine dipstick LARGE (*)    Ketones, ur 5 (*)    Protein, ur 100 (*)    Bacteria, UA RARE (*)    All other components  within normal limits  BRAIN NATRIURETIC PEPTIDE - Abnormal; Notable for the following components:   B Natriuretic Peptide 817.9 (*)    All other components within normal limits  ACETAMINOPHEN LEVEL - Abnormal; Notable for the following components:   Acetaminophen (Tylenol), Serum <10 (*)    All other components within normal limits  I-STAT CHEM 8, ED - Abnormal; Notable for the following components:   BUN 53 (*)    Creatinine, Ser 1.70 (*)    Glucose, Bld 126 (*)    Hemoglobin 11.6 (*)    HCT 34.0 (*)    All other components within normal limits  I-STAT TROPONIN, ED - Abnormal; Notable for the following components:   Troponin i, poc 0.57 (*)    All other components within normal limits  CBG MONITORING, ED - Abnormal; Notable for the following components:   Glucose-Capillary 121 (*)    All other components within normal limits  URINE CULTURE  CULTURE, BLOOD (ROUTINE X 2)  CULTURE, BLOOD (ROUTINE X 2)  PROTIME-INR  RAPID URINE DRUG SCREEN, HOSP PERFORMED  MAGNESIUM  PHOSPHORUS  CREATININE, URINE, RANDOM  SODIUM, URINE, RANDOM  AMMONIA  TSH  HEPATITIS PANEL, ACUTE  HEMOGLOBIN A1C  LIPID PANEL  TROPONIN I  TROPONIN I    EKG EKG Interpretation  Date/Time:  Monday June 22 2017 17:38:23 EDT Ventricular Rate:  46 PR Interval:      QRS Duration: 118 QT Interval:  507 QTC Calculation: 444 R Axis:   15 Text Interpretation:  Atrial fibrillation Nonspecific intraventricular conduction delay No acute changes No old tracing to compare Confirmed by Derwood Kaplan [id number]) on 06/22/2017 6:23:07 PM   Radiology Ct Head Wo Contrast  Result Date: 06/22/2017 CLINICAL DATA:  Recent fall, altered mental status, hypertension, trauma EXAM: CT HEAD WITHOUT CONTRAST CT CERVICAL SPINE WITHOUT CONTRAST TECHNIQUE: Multidetector CT imaging of the head and cervical spine was performed following the standard protocol without intravenous contrast. Multiplanar CT image reconstructions of the cervical spine were also generated. COMPARISON:  None. FINDINGS: CT HEAD FINDINGS Brain: Brain atrophy noted with chronic white matter microvascular changes. Remote left parietal infarct with encephalomalacia. No acute intracranial hemorrhage, new mass lesion, new infarction, midline shift, herniation, hydrocephalus. No extra-axial fluid collection. Cisterns are patent. Cerebellar atrophy as well. Vascular: Intracranial atherosclerosis.  No hyperdense vessel. Skull: Normal. Negative for fracture or focal lesion. Sinuses/Orbits: Orbits are symmetric. Minor polypoid mucosal thickening in the right maxillary sinus. No sinus air-fluid level. Other: None. CT CERVICAL SPINE FINDINGS Alignment: Normal. Skull base and vertebrae: No acute fracture. No primary bone lesion or focal pathologic process. Soft tissues and spinal canal: No prevertebral fluid or swelling. No visible canal hematoma. Disc levels: Cervical degenerative spondylosis at all levels with disc space narrowing, sclerosis and osteophytes. C4-5 and C5-6 are most severe. Multilevel facet arthropathy, worse on the right. No subluxation dislocation. Normal prevertebral soft tissues. Degenerative changes of the C1-2 articulation. Upper chest: Negative. Other: None. IMPRESSION: No acute intracranial abnormality by  noncontrast CT. Brain atrophy and chronic white matter microvascular changes with remote left parietal infarct noted. Cervical degenerative spondylosis without acute osseous finding, fracture or malalignment. Electronically Signed   By: Judie Petit.  Shick M.D.   On: 06/22/2017 19:48   Ct Cervical Spine Wo Contrast  Result Date: 06/22/2017 CLINICAL DATA:  Recent fall, altered mental status, hypertension, trauma EXAM: CT HEAD WITHOUT CONTRAST CT CERVICAL SPINE WITHOUT CONTRAST TECHNIQUE: Multidetector CT imaging of the head and cervical spine was performed following the standard protocol  without intravenous contrast. Multiplanar CT image reconstructions of the cervical spine were also generated. COMPARISON:  None. FINDINGS: CT HEAD FINDINGS Brain: Brain atrophy noted with chronic white matter microvascular changes. Remote left parietal infarct with encephalomalacia. No acute intracranial hemorrhage, new mass lesion, new infarction, midline shift, herniation, hydrocephalus. No extra-axial fluid collection. Cisterns are patent. Cerebellar atrophy as well. Vascular: Intracranial atherosclerosis.  No hyperdense vessel. Skull: Normal. Negative for fracture or focal lesion. Sinuses/Orbits: Orbits are symmetric. Minor polypoid mucosal thickening in the right maxillary sinus. No sinus air-fluid level. Other: None. CT CERVICAL SPINE FINDINGS Alignment: Normal. Skull base and vertebrae: No acute fracture. No primary bone lesion or focal pathologic process. Soft tissues and spinal canal: No prevertebral fluid or swelling. No visible canal hematoma. Disc levels: Cervical degenerative spondylosis at all levels with disc space narrowing, sclerosis and osteophytes. C4-5 and C5-6 are most severe. Multilevel facet arthropathy, worse on the right. No subluxation dislocation. Normal prevertebral soft tissues. Degenerative changes of the C1-2 articulation. Upper chest: Negative. Other: None. IMPRESSION: No acute intracranial abnormality by  noncontrast CT. Brain atrophy and chronic white matter microvascular changes with remote left parietal infarct noted. Cervical degenerative spondylosis without acute osseous finding, fracture or malalignment. Electronically Signed   By: Judie Petit.  Shick M.D.   On: 06/22/2017 19:48   Dg Chest Port 1 View  Result Date: 06/22/2017 CLINICAL DATA:  Bradycardia EXAM: PORTABLE CHEST 1 VIEW COMPARISON:  None. FINDINGS: Heart size upper normal. Negative for heart failure. Atherosclerotic aortic arch. Lungs are clear without infiltrate effusion or edema. IMPRESSION: No active disease. Electronically Signed   By: Marlan Palau M.D.   On: 06/22/2017 18:15    Procedures Procedures (including critical care time)  Medications Ordered in ED Medications  atropine 1 MG/10ML injection (has no administration in time range)  aspirin chewable tablet 324 mg (has no administration in time range)  finasteride (PROSCAR) tablet 5 mg (5 mg Oral Not Given 06/22/17 2223)  levothyroxine (SYNTHROID, LEVOTHROID) tablet 75 mcg (75 mcg Oral Not Given 06/22/17 2223)  CENTRUM SILVER 50+MEN TABS 1 tablet (1 tablet Oral Not Given 06/22/17 2223)  rOPINIRole (REQUIP) tablet 8 mg (8 mg Oral Not Given 06/22/17 2224)  atropine 1 MG/10ML injection 0.5 mg (has no administration in time range)  0.9 %  sodium chloride infusion (has no administration in time range)  ondansetron (ZOFRAN) injection 4 mg (has no administration in time range)  hydrALAZINE (APRESOLINE) injection 5 mg (has no administration in time range)  zolpidem (AMBIEN) tablet 5 mg (has no administration in time range)  nitroGLYCERIN (NITROSTAT) SL tablet 0.4 mg (has no administration in time range)  morphine 4 MG/ML injection 1 mg (has no administration in time range)  enoxaparin (LOVENOX) injection 40 mg (has no administration in time range)  sodium chloride 0.9 % bolus 500 mL (0 mLs Intravenous Stopped 06/22/17 1924)  aspirin suppository 600 mg (600 mg Rectal Given 06/22/17 1911)       Initial Impression / Assessment and Plan / ED Course  I have reviewed the triage vital signs and the nursing notes.  Pertinent labs & imaging results that were available during my care of the patient were reviewed by me and considered in my medical decision making (see chart for details).     Olanda Downie is a 84 y.o. male with PMHx of HTN, hypothyroidism, CKD, HFpEF who presents via EMS for altered mental status today. Found to be bradycardic, hypotensive, improved after single dose of atropine. Reviewed and confirmed nursing  documentation for past medical history, family history, social history. VS afebrile, HR 40s, BP 120s systolic. Exam overall unremarkable, no focal deficits. Ddx includes ischemia, electrolyte abnormalities, tox (takes OTC supplements), hypothyroidism (though nl TSH 7 days ago per care everywhere).  EKG with atrial fibrillation with slow ventricular rate in 40s. No STE/D. No prior h/o atrial fibrillation. I-stat chem 8 with elevated Cr, otherwise unremarkable with nl K. I-Stat trop 0.57, lab troponin similar at 0.52. Rectal ASA given. CMP confirmed nl electrolytes, Cr 1.94, elevated transaminases AST 296, ALT 79. This appears new compared to labs drawn last week, question 2/2 hypotension, tox. CBC with leukocytosis 15.5, normocytic anemia with hgb 12 (baseline). Mag, phos both wnl. UA unremarkable. Sepsis score high- clinically do not believe current state related to sepsis.   CXR neg. CT head neg, CT C spine neg. Monitored in department with slow improvement in HR to 50s, BP improvement after NS. Required no additional atropine in department. Discussed case with cardiology, Dr.Nelson, who requested medicine admission. They will consult.   Old records reviewed. Labs reviewed by me and used in the medical decision making.  Imaging viewed and interpreted by me and used in the medical decision making (formal interpretation from radiologist). EKG reviewed by me and used  in the medical decision making. Admitted to hospitalist.     Final Clinical Impressions(s) / ED Diagnoses   Final diagnoses:  Symptomatic bradycardia    ED Discharge Orders    None       Diannia Ruder, MD 06/22/17 2250    Derwood Kaplan, MD 06/24/17 1610    Derwood Kaplan, MD 06/24/17 [phone]

## 2017-06-22 NOTE — ED Triage Notes (Signed)
Pt asrrives EMS [facility] where EMS was called out for stroke symptoms. EMS called earlier for fall but pt refused transport at that time and family then saw him and states he was not his norm. Pt was initially with brady at 34 and normotensive but became hypotensive to 80/40 and was given 0.5 mg atropine in transport  with hr then 66 and bp 90/60. Given 700 cc ns. Denies pain but c/o weakness all over.

## 2017-06-22 NOTE — ED Notes (Signed)
Pt's CBG result was 121. Informed Millie - RN.

## 2017-06-23 ENCOUNTER — Encounter (HOSPITAL_COMMUNITY): Payer: Self-pay | Admitting: Internal Medicine

## 2017-06-23 ENCOUNTER — Inpatient Hospital Stay (HOSPITAL_COMMUNITY): Payer: Medicare Other

## 2017-06-23 ENCOUNTER — Other Ambulatory Visit: Payer: Self-pay

## 2017-06-23 DIAGNOSIS — I5032 Chronic diastolic (congestive) heart failure: Secondary | ICD-10-CM

## 2017-06-23 DIAGNOSIS — R748 Abnormal levels of other serum enzymes: Secondary | ICD-10-CM

## 2017-06-23 DIAGNOSIS — I4891 Unspecified atrial fibrillation: Principal | ICD-10-CM

## 2017-06-23 DIAGNOSIS — I10 Essential (primary) hypertension: Secondary | ICD-10-CM

## 2017-06-23 DIAGNOSIS — R001 Bradycardia, unspecified: Secondary | ICD-10-CM

## 2017-06-23 LAB — CBC
HCT: 33.7 % — ABNORMAL LOW (ref 39.0–52.0)
HEMOGLOBIN: 11.1 g/dL — AB (ref 13.0–17.0)
MCH: 30.1 pg (ref 26.0–34.0)
MCHC: 32.9 g/dL (ref 30.0–36.0)
MCV: 91.3 fL (ref 78.0–100.0)
PLATELETS: 253 10*3/uL (ref 150–400)
RBC: 3.69 MIL/uL — ABNORMAL LOW (ref 4.22–5.81)
RDW: 13.3 % (ref 11.5–15.5)
WBC: 15.5 10*3/uL — ABNORMAL HIGH (ref 4.0–10.5)

## 2017-06-23 LAB — HEPATIC FUNCTION PANEL
ALBUMIN: 3.1 g/dL — AB (ref 3.5–5.0)
ALK PHOS: 78 U/L (ref 38–126)
ALT: 118 U/L — ABNORMAL HIGH (ref 17–63)
AST: 374 U/L — ABNORMAL HIGH (ref 15–41)
Bilirubin, Direct: 0.2 mg/dL (ref 0.1–0.5)
Indirect Bilirubin: 0.9 mg/dL (ref 0.3–0.9)
TOTAL PROTEIN: 5.3 g/dL — AB (ref 6.5–8.1)
Total Bilirubin: 1.1 mg/dL (ref 0.3–1.2)

## 2017-06-23 LAB — TSH: TSH: 1.509 u[IU]/mL (ref 0.350–4.500)

## 2017-06-23 LAB — TROPONIN I
Troponin I: 0.41 ng/mL (ref <0.03)
Troponin I: 0.31 ng/mL (ref <0.03)

## 2017-06-23 LAB — LIPID PANEL
CHOLESTEROL: 143 mg/dL (ref 0–200)
HDL: 61 mg/dL (ref >40)
LDL Cholesterol: 70 mg/dL (ref 0–99)
TRIGLYCERIDES: 60 mg/dL (ref <150)
Total CHOL/HDL Ratio: 2.3 RATIO
VLDL: 12 mg/dL (ref 0–40)

## 2017-06-23 LAB — HEMOGLOBIN A1C
HEMOGLOBIN A1C: 5.7 % — AB (ref 4.8–5.6)
Mean Plasma Glucose: 116.89 mg/dL

## 2017-06-23 LAB — VITAMIN B12: VITAMIN B 12: 663 pg/mL (ref 180–914)

## 2017-06-23 LAB — CK: CK TOTAL: 11692 U/L — AB (ref 49–397)

## 2017-06-23 LAB — FOLATE: FOLATE: 36 ng/mL (ref >5.9)

## 2017-06-23 MED ORDER — ASPIRIN 81 MG PO CHEW: 81.0000 mg | CHEWABLE_TABLET | Freq: Every day | ORAL | Status: DC

## 2017-06-23 MED ORDER — ASPIRIN 81 MG PO CHEW
81.0000 mg | CHEWABLE_TABLET | Freq: Every day | ORAL | Status: DC
  Administered 2017-06-23 – 2017-06-26 (×4): 81 mg via ORAL
  Filled 2017-06-23 (×4): qty 1

## 2017-06-23 MED ORDER — ACETAMINOPHEN 325 MG PO TABS
650.0000 mg | ORAL_TABLET | ORAL | Status: DC | PRN
  Administered 2017-06-24 – 2017-06-26 (×3): 650 mg via ORAL
  Filled 2017-06-23 (×5): qty 2

## 2017-06-23 MED ORDER — ROPINIROLE HCL 1 MG PO TABS
2.0000 mg | ORAL_TABLET | Freq: Two times a day (BID) | ORAL | Status: DC
  Administered 2017-06-23: 2 mg via ORAL
  Filled 2017-06-23: qty 2

## 2017-06-23 NOTE — Clinical Social Work Placement (Signed)
   CLINICAL SOCIAL WORK PLACEMENT  NOTE  Date:  06/23/2017  Patient Details  Name: Gregory Mcpherson MRN: [id number] Date of Birth: 1920/06/04  Clinical Social Work is seeking post-discharge placement for this patient at the Skilled  Nursing Facility level of care (*CSW will initial, date and re-position this form in  chart as items are completed):  Yes   Patient/family provided with [facility] Clinical Social Work Department's list of facilities offering this level of care within the geographic area requested by the patient (or if unable, by the patient's family).  Yes   Patient/family informed of their freedom to choose among providers that offer the needed level of care, that participate in Medicare, Medicaid or managed care program needed by the patient, have an available bed and are willing to accept the patient.  Yes   Patient/family informed of [facility] ownership interest in [facility] and [facility], as well as of the fact that they are under no obligation to receive care at these facilities.  PASRR submitted to EDS on 06/23/17     PASRR number received on       Existing PASRR number confirmed on       FL2 transmitted to all facilities in geographic area requested by pt/family on 06/23/17     FL2 transmitted to all facilities within larger geographic area on       Patient informed that his/her managed care company has contracts with or will negotiate with certain facilities, including the following:            Patient/family informed of bed offers received.  Patient chooses bed at       Physician recommends and patient chooses bed at      Patient to be transferred to   on  .  Patient to be transferred to facility by       Patient family notified on   of transfer.  Name of family member notified:        PHYSICIAN Please sign FL2     Additional Comment:    _______________________________________________ Margarito LinerSarah C Astria Jordahl, LCSW 06/23/2017, 11:37  AM

## 2017-06-23 NOTE — NC FL2 (Signed)
[place] MEDICAID FL2 LEVEL OF CARE SCREENING TOOL     IDENTIFICATION  Patient Name: Gregory Mcpherson Birthdate: 27-Feb-1920 Sex: male Admission Date (Current Location): 06/22/2017  [facility] and [place] Number:  [employer] and Address:  [facility], [address] [place], [place] [address]      Provider Number: [id number]  Attending Physician Name and Address:  Cathren Harsh, MD  Relative Name and Phone Number:       Current Level of Care: Hospital Recommended Level of Care: Skilled Nursing Facility Prior Approval Number:    Date Approved/Denied:   PASRR Number: Manual review  Discharge Plan: SNF    Current Diagnoses: Patient Active Problem List   Diagnosis Date Noted  . Acute renal failure superimposed on stage 3 chronic kidney disease (HCC) 06/22/2017  . Acute metabolic encephalopathy 06/22/2017  . Fall 06/22/2017  . New onset atrial fibrillation (HCC) 06/22/2017  . Elevated troponin 06/22/2017  . Hypotension 06/22/2017  . Bradycardia 06/22/2017  . Leukocytosis 06/22/2017  . Abnormal LFTs 06/22/2017  . Chronic diastolic CHF (congestive heart failure) (HCC)   . Hypertension   . Hypothyroidism   . Depression     Orientation RESPIRATION BLADDER Height & Weight     Self  Normal Continent, External catheter Weight: 165 lb 9.1 oz (75.1 kg) Height:  5\' 10"  (177.8 cm)  BEHAVIORAL SYMPTOMS/MOOD NEUROLOGICAL BOWEL NUTRITION STATUS  Other (Comment)(Calm, cooperative, anxious.) (None) Continent (Currently NPO for test.)  AMBULATORY STATUS COMMUNICATION OF NEEDS Skin   Limited Assist Verbally Bruising                       Personal Care Assistance Level of Assistance  Bathing, Feeding, Dressing Bathing Assistance: Maximum assistance Feeding assistance: Limited assistance Dressing Assistance: Maximum assistance     Functional Limitations Info  Sight, Hearing, Speech Sight Info: Adequate Hearing Info: Adequate Speech  Info: Adequate    SPECIAL CARE FACTORS FREQUENCY  PT (By licensed PT), Blood pressure, OT (By licensed OT)     PT Frequency: 5 x week OT Frequency: 5 x week            Contractures Contractures Info: Not present    Additional Factors Info  Code Status, Allergies, Psychotropic Code Status Info: DNR Allergies Info: Pravastatin Sodium, Tape, Niacin. Psychotropic Info: Depression: No psychotropic meds.         Current Medications (06/23/2017):  This is the current hospital active medication list Current Facility-Administered Medications  Medication Dose Route Frequency Provider Last Rate Last Dose  . 0.9 %  sodium chloride infusion   Intravenous Continuous Lorretta Harp, MD 50 mL/hr at 06/23/17 0203    . aspirin chewable tablet 324 mg  324 mg Oral Daily Lorretta Harp, MD      . atropine 1 MG/10ML injection 0.5 mg  0.5 mg Intravenous PRN Lorretta Harp, MD      . enoxaparin (LOVENOX) injection 30 mg  30 mg Subcutaneous Daily Lorretta Harp, MD      . finasteride (PROSCAR) tablet 5 mg  5 mg Oral Daily Lorretta Harp, MD      . hydrALAZINE (APRESOLINE) injection 5 mg  5 mg Intravenous Q2H PRN Lorretta Harp, MD      . levothyroxine (SYNTHROID, LEVOTHROID) tablet 75 mcg  75 mcg Oral Daily Lorretta Harp, MD      . morphine 4 MG/ML injection 1 mg  1 mg Intravenous Q4H PRN Lorretta Harp, MD      .  multivitamin with minerals tablet 1 tablet  1 tablet Oral Daily Lorretta HarpNiu, Xilin, MD      . nitroGLYCERIN (NITROSTAT) SL tablet 0.4 mg  0.4 mg Sublingual Q5 min PRN Lorretta HarpNiu, Xilin, MD      . ondansetron [facility](ZOFRAN) injection 4 mg  4 mg Intravenous Q8H PRN Lorretta HarpNiu, Xilin, MD      . rOPINIRole (REQUIP) tablet 8 mg  8 mg Oral BID Lorretta HarpNiu, Xilin, MD      . zolpidem (AMBIEN) tablet 5 mg  5 mg Oral QHS PRN Lorretta HarpNiu, Xilin, MD         Discharge Medications: Please see discharge summary for a list of discharge medications.  Relevant Imaging Results:  Relevant Lab Results:   Additional Information SS#: [id number]. Lives at [facility] [place].  Margarito LinerSarah C Enola Siebers, LCSW

## 2017-06-23 NOTE — Progress Notes (Signed)
RN met resistance when trying to in and out pt. Provider notified.

## 2017-06-23 NOTE — Clinical Social Work Note (Addendum)
Clinical Social Work Assessment  Patient Details  Name: Gregory Mcpherson MRN: [id number] Date of Birth: 02/15/1920  Date of referral:  06/23/17               Reason for consult:  Facility Placement, Discharge Planning                Permission sought to share information with:  Facility Medical sales representativeContact Representative, Family Supports Permission granted to share information::     Name::     Larene BeachDoug Hilscher  Agency::  SNF's  Relationship::  Son  Contact Information:  [phone]  Housing/Transportation Living arrangements for the past 2 months:  Independent Living Facility Source of Information:  Medical Team, Adult Children Patient Interpreter Needed:  None Criminal Activity/Legal Involvement Pertinent to Current Situation/Hospitalization:  No - Comment as needed Significant Relationships:  Adult Children Lives with:  Self Do you feel safe going back to the place where you live?  Yes Need for family participation in patient care:  Yes (Comment)  Care giving concerns:  Patient is a resident at [facility]. PT recommending SNF once medically stable for discharge.   Social Worker assessment / plan:  Patient out of room for test. Son, Nedra HaiLee went with him. Other son, Gala RomneyDoug available in room. CSW introduced role and explained that PT recommendations would be discussed. Patient's son confirmed he is from [facility]. He is interested in SNF placement either at [facility] or potentially in [place] where he and his brother live. CSW encouraged him to discuss SNF options in that area with his brother. CSW notified him that they would have to transport patient by car or pay out of pocket for ambulance transport due to it being out of the 50 mile limit. PASARR under manual review. Patient cannot go to SNF until PASARR obtained. No further concerns. CSW encouraged patient's son to contact CSW as needed. CSW will continue to follow patient and his sons for support and facilitate discharge to SNF  once medically stable.  Employment status:  Retired Database administratornsurance information:  Managed Medicare PT Recommendations:  Skilled Nursing Facility Information / Referral to community resources:  Skilled Nursing Facility  Patient/Family's Response to care:  Patient not in room. Patient's son agreeable to SNF placement. Patient's sons supportive and involved in patient's care. Patient's son appreciated social work intervention.  Patient/Family's Understanding of and Emotional Response to Diagnosis, Current Treatment, and Prognosis:  Patient not in room. Patient's son has a good understanding of the reason for admission and his need for rehab prior to returning to independent living. Patient's son appears happy with hospital care.  Emotional Assessment Appearance:  Appears stated age Attitude/Demeanor/Rapport:  Unable to Assess Affect (typically observed):  Unable to Assess Orientation:  Oriented to Self Alcohol / Substance use:  Never Used Psych involvement (Current and /or in the community):  No (Comment)  Discharge Needs  Concerns to be addressed:  Care Coordination Readmission within the last 30 days:  No Current discharge risk:  Cognitively Impaired, Dependent with Mobility, Lives alone Barriers to Discharge:  Continued Medical Work up, Insurance Authorization   Margarito LinerSarah C Shaneen Reeser, LCSW 06/23/2017, 11:31 AM

## 2017-06-23 NOTE — Clinical Social Work Note (Signed)
[facility] is able to offer patient a bed for rehab. Son, Gregory Mcpherson, notified and has accepted their bed offer. Please notify CSW at least 24 hours prior to discharge so bed can be secured and insurance authorization started.  Charlynn CourtSarah Khylie Larmore, CSW [phone]

## 2017-06-23 NOTE — Care Management Note (Signed)
Case Management Note  Patient Details  Name: Gregory Mcpherson MRN: [id number] Date of Birth: [birth year]  Subjective/Objective:   Acute Metabolic Encephalopathy                Action/Plan: Patient resides in [facility] facility; SW is aware; CM will continue to follow for progression of care  Expected Discharge Date:   possibly 06/27/2017               Expected Discharge Plan:  Skilled Nursing Facility / ALF  In-House Referral:   SW  Discharge planning Services  CM Consult  Status of Service:  In process, will continue to follow  Reola MosherChandler, Jentry Warnell L, RN,MHA,BSN [phone] 06/23/2017, 11:21 AM

## 2017-06-23 NOTE — Evaluation (Signed)
Clinical/Bedside Swallow Evaluation Patient Details  Name: Gregory Mcpherson MRN: [id number] Date of Birth: [birth year]  Today's Date: 06/23/2017 Time: SLP Start Time (ACUTE ONLY): 1558 SLP Stop Time (ACUTE ONLY): 1610 SLP Time Calculation (min) (ACUTE ONLY): 12 min  Past Medical History:  Past Medical History:  Diagnosis Date  . CHF (congestive heart failure) (HCC)   . CKD (chronic kidney disease), stage III (HCC)   . Depression   . Hypertension   . Hypothyroidism   . Thyroid disease    Past Surgical History: History reviewed. No pertinent surgical history. HPI:  Gregory Mcpherson as a 84 yo M with hx of hypertension, dCHF, hypothyroidism, depression, CKD-3, and dementia. He presented to ED after a fall with AMS.  CT revealed no acute findings.  CXR showed clear lungs.   Assessment / Plan / Recommendation Clinical Impression  Pt presents with functional oropharyngeal swallowing as assessed clinically. Pt exhibited good oral clearance of solids with independent lingual sweeps.  With initial trial of thin liquid by cup pt exhibited multiple swallows.  There was a single throat clear following serial sips of thin liquid by straw.  With further trials challenging pharyngeal swallow function with serial straw sips, pt exhibited no overt s/s of aspiration. Pt consumed close to 8 oz of water during this evaluation.  CXR is not concerning for pneumonia or aspiration.  Recommend regular texture diet with thin liquid.  SLP Visit Diagnosis: Dysphagia, unspecified (R13.10)    Aspiration Risk  No limitations    Diet Recommendation Regular;Thin liquid   Liquid Administration via: Cup;Straw Medication Administration: Whole meds with liquid Supervision: Patient able to self feed Compensations: Slow rate;Small sips/bites Postural Changes: Seated upright at 90 degrees    Other  Recommendations Oral Care Recommendations: Oral care BID   Follow up Recommendations None        Swallow Study    General Date of Onset: 06/22/17 HPI: Gregory Mcpherson as a 84 yo M with hx of hypertension, dCHF, hypothyroidism, depression, CKD-3, and dementia. He presented to ED after a fall with AMS.  CT revealed no acute findings.  CXR showed clear lungs. Type of Study: Bedside Swallow Evaluation Diet Prior to this Study: NPO Temperature Spikes Noted: No Respiratory Status: Room air History of Recent Intubation: No Behavior/Cognition: Alert;Cooperative;Pleasant mood Oral Cavity Assessment: Within Functional Limits Oral Care Completed by SLP: No Oral Cavity - Dentition: Adequate natural dentition Vision: Functional for self-feeding Self-Feeding Abilities: Able to feed self Patient Positioning: Upright in bed Baseline Vocal Quality: (Gravelly) Volitional Cough: Strong Volitional Swallow: Able to elicit    Oral/Motor/Sensory Function Overall Oral Motor/Sensory Function: Mild impairment Facial ROM: Within Functional Limits Facial Symmetry: Within Functional Limits Lingual ROM: Within Functional Limits Lingual Symmetry: Within Functional Limits Lingual Strength: Reduced Velum: Within Functional Limits Mandible: Within Functional Limits   Ice Chips Ice chips: Not tested   Thin Liquid Thin Liquid: Impaired Presentation: Cup;Straw Oral Phase Impairments: Reduced labial seal Oral Phase Functional Implications: Right anterior spillage Pharyngeal  Phase Impairments: Multiple swallows;Throat Clearing - Delayed    Nectar Thick Nectar Thick Liquid: Not tested   Honey Thick Honey Thick Liquid: Not tested   Puree Puree: Within functional limits Presentation: Spoon   Solid   GO   Solid: Within functional limits Presentation: Self Fed        Annell GreeningLeigh E Tabius Rood, MA, CCC-SLP 06/23/2017,4:23 PM

## 2017-06-23 NOTE — Evaluation (Signed)
Occupational Therapy Evaluation Patient Details Name: Gregory Mcpherson MRN: [id number] DOB: [birth year] Today's Date: 06/23/2017    History of Present Illness Pt is a 84 y.o. male admitted 06/22/17 after son found him to be mildly confused; EMS found pt to be bradycardic. Worked up for acute metabolic encephalopathy with recent fall. CT head negative. MRI ordered to rule out stroke, but son declining. PMH includes HTN, CHF, depression, CKD III, R knee OA, new onset a-fib.    Clinical Impression   Per chart and pt report, he was living alone in an apartment at an ALF. Pt reporting he performed all ADLs, used a RW, and facility staff performed IADLs; pt poor historian and no family present to confirm information. Pt currently requiring Min A for UB ADLs, Max A for LB ADLs, and Min A +2 for functional mobility with RW. Pt presenting with decreased cognition, balance, strength, and awareness. Pt would benefit from further acute OT to facilitate safe dc. Recommend dc to SNF for further OT to optimize safety, independence with ADLs, and return to PLOF.      Follow Up Recommendations  SNF;Supervision/Assistance - 24 hour    Equipment Recommendations  Other (comment)(Defer to next venue)    Recommendations for Other Services PT consult     Precautions / Restrictions Precautions Precautions: Fall Restrictions Weight Bearing Restrictions: No      Mobility Bed Mobility Overal bed mobility: Needs Assistance Bed Mobility: Supine to Sit;Sit to Supine     Supine to sit: Supervision Sit to supine: Min assist   General bed mobility comments: Pt received attempting to exit bed with bedrail up. MinA to return to supine. Pt inconsistently following commands for bed mobility, requiring modA+2 to scoot up   Transfers Overall transfer level: Needs assistance Equipment used: Rolling walker (2 wheeled) Transfers: Sit to/from Stand Sit to Stand: Min assist         General transfer comment:  Increased time and effort. Able to stand on 3rd attempt with minA for trunk elevation and use of RW    Balance Overall balance assessment: Needs assistance   Sitting balance-Leahy Scale: Fair     Standing balance support: Bilateral upper extremity supported;During functional activity Standing balance-Leahy Scale: Poor Standing balance comment: Reliant on UE support and physical A                           ADL either performed or assessed with clinical judgement   ADL Overall ADL's : Needs assistance/impaired Eating/Feeding: Set up;Sitting;Bed level   Grooming: Min guard;Sitting   Upper Body Bathing: Minimal assistance;Sitting   Lower Body Bathing: Maximal assistance;+2 for safety/equipment;Sit to/from stand   Upper Body Dressing : Minimal assistance;Sitting Upper Body Dressing Details (indicate cue type and reason): Donning/doffing second gown like jacket Lower Body Dressing: Maximal assistance;Sit to/from stand;+2 for safety/equipment Lower Body Dressing Details (indicate cue type and reason): Pt requiring Max A for donning socks stating "I can't reach down there." Later in session, pt began doffing socks while sitting at EOB with MIn Guard without prompt. Pt requiring increased assistance for standing balance.  Toilet Transfer: Minimal assistance;RW;+2 for safety/equipment(simulated in room)           Functional mobility during ADLs: Minimal assistance;+2 for safety/equipment;Rolling walker;Cueing for safety;Cueing for sequencing General ADL Comments: Pt with decreased balance, strength, and cognition impacting his functional performance.      Vision Baseline Vision/History: Wears glasses Wears Glasses: At all times Patient Visual  Report: No change from baseline       Perception     Praxis      Pertinent Vitals/Pain Pain Assessment: No/denies pain     Hand Dominance     Extremity/Trunk Assessment Upper Extremity Assessment Upper Extremity  Assessment: Generalized weakness   Lower Extremity Assessment Lower Extremity Assessment: Generalized weakness   Cervical / Trunk Assessment Cervical / Trunk Assessment: Kyphotic   Communication Communication Communication: HOH   Cognition Arousal/Alertness: Awake/alert Behavior During Therapy: WFL for tasks assessed/performed Overall Cognitive Status: History of cognitive impairments - at baseline Area of Impairment: Orientation;Attention;Memory;Following commands;Safety/judgement;Awareness;Problem solving                 Orientation Level: Disoriented to;Place;Time;Situation Current Attention Level: Sustained Memory: Decreased short-term memory Following Commands: Follows multi-step commands inconsistently Safety/Judgement: Decreased awareness of safety;Decreased awareness of deficits Awareness: Intellectual Problem Solving: Slow processing;Requires verbal cues;Difficulty sequencing General Comments: Pt asking "Am I in the hospital?" Telling pt he is at [facility] and then he was able to state "Oh, so I am in [place]." Pt requiring increased cues for attention and safety.   General Comments       Exercises     Shoulder Instructions      Home Living Family/patient expects to be discharged to:: Assisted living                             Home Equipment: Walker - 2 wheels   Additional Comments: Pt poor historian. Reports he lives in 2-bedroom apartment alone in [place]; son lives in [place]      Prior Functioning/Environment Level of Independence: Needs assistance  Gait / Transfers Assistance Needed: Pt poor historian. When asked about RW, reports he uses one.  ADL's / Homemaking Assistance Needed: Reports indep with all ADLs.  "The people at the place help me with cooking and cleaning"            OT Problem List: Decreased strength;Decreased range of motion;Decreased activity tolerance;Impaired balance (sitting and/or  standing);Decreased cognition;Decreased safety awareness;Decreased knowledge of use of DME or AE;Decreased knowledge of precautions      OT Treatment/Interventions: Self-care/ADL training;Therapeutic exercise;Energy conservation;DME and/or AE instruction;Therapeutic activities;Balance training;Patient/family education    OT Goals(Current goals can be found in the care plan section) Acute Rehab OT Goals Patient Stated Goal: Get out of bed OT Goal Formulation: With patient Time For Goal Achievement: 07/07/17 Potential to Achieve Goals: Good ADL Goals Pt Will Perform Grooming: standing;with supervision;with set-up Pt Will Perform Upper Body Dressing: with set-up;with supervision;sitting Pt Will Perform Lower Body Dressing: with set-up;with supervision;sit to/from stand Pt Will Transfer to Toilet: ambulating;bedside commode;with min guard assist Pt Will Perform Toileting - Clothing Manipulation and hygiene: with min guard assist;sit to/from stand;sitting/lateral leans  OT Frequency: Min 2X/week   Barriers to D/C: Other (comment)(Unsure of support at ALF)          Co-evaluation PT/OT/SLP Co-Evaluation/Treatment: Yes Reason for Co-Treatment: For patient/therapist safety;To address functional/ADL transfers   OT goals addressed during session: ADL's and self-care      AM-PAC PT "6 Clicks" Daily Activity     Outcome Measure Help from another person eating meals?: A Little Help from another person taking care of personal grooming?: A Little Help from another person toileting, which includes using toliet, bedpan, or urinal?: A Lot Help from another person bathing (including washing, rinsing, drying)?: A Lot Help from another person to put on and taking  off regular upper body clothing?: A Little Help from another person to put on and taking off regular lower body clothing?: A Lot 6 Click Score: 15   End of Session Equipment Utilized During Treatment: Gait belt;Rolling walker Nurse  Communication: Mobility status  Activity Tolerance: Patient tolerated treatment well Patient left: in bed;with call bell/phone within reach;with bed alarm set  OT Visit Diagnosis: Unsteadiness on feet (R26.81);Other abnormalities of gait and mobility (R26.89);Muscle weakness (generalized) (M62.81)                Time: 1610-9604 OT Time Calculation (min): 19 min Charges:  OT General Charges $OT Visit: 1 Visit OT Evaluation $OT Eval Moderate Complexity: 1 Mod G-Codes:     Chevon Fomby MSOT, OTR/L Acute Rehab Pager: [phone] Office: [phone]  Gregory Mcpherson 06/23/2017, 9:52 AM

## 2017-06-23 NOTE — Evaluation (Signed)
Physical Therapy Evaluation Patient Details Name: Gregory Mcpherson MRN: [id number] DOB: April 19, 1920 Today's Date: 06/23/2017   History of Present Illness  Pt is a 84 y.o. male admitted 06/22/17 after son found him to be mildly confused; EMS found pt to be bradycardic. Worked up for acute metabolic encephalopathy with recent fall. CT head negative. MRI ordered to rule out stroke, but son declining. PMH includes HTN, CHF, depression, CKD III, R knee OA, new onset a-fib.     Clinical Impression  Pt presents with an overall decrease in functional mobility secondary to above. Pt currently confused and poor historian- reports he lives alone in apartment, amb with RW and indep with ADLs; facility staff assist with household tasks. Endorses recent falls, although disoriented to time, place and situation. Today, pt amb with RW and minA (+2 safety/equipment) to maintain balance; pt at significant risks for falls with bilat knee instability and poor awareness. Pt would benefit from continued acute PT services to maximize functional mobility and independence prior to d/c with SNF-level therapies.    Follow Up Recommendations SNF;Supervision/Assistance - 24 hour    Equipment Recommendations  None recommended by PT    Recommendations for Other Services       Precautions / Restrictions Precautions Precautions: Fall Restrictions Weight Bearing Restrictions: No      Mobility  Bed Mobility Overal bed mobility: Needs Assistance Bed Mobility: Supine to Sit;Sit to Supine     Supine to sit: Supervision Sit to supine: Min assist   General bed mobility comments: Pt received attempting to exit bed with bedrail up. MinA to return to supine. Pt inconsistently following commands for bed mobility, requiring modA+2 to scoot up   Transfers Overall transfer level: Needs assistance Equipment used: Rolling walker (2 wheeled) Transfers: Sit to/from Stand Sit to Stand: Min assist         General transfer  comment: Increased time and effort. Able to stand on 3rd attempt with minA for trunk elevation and use of RW  Ambulation/Gait Ambulation/Gait assistance: Min assist;+2 safety/equipment Ambulation Distance (Feet): 40 Feet Assistive device: Rolling walker (2 wheeled) Gait Pattern/deviations: Step-to pattern;Shuffle;Trunk flexed;Narrow base of support Gait velocity: Decreased Gait velocity interpretation: <1.31 ft/sec, indicative of household ambulator General Gait Details: Slow, shuffling, unsteady amb with RW and minA to maintain balance; bilat knee instability throughout. MinA to prevent LOB with turns as pt leaving RW behind (+2 safety/equipment)  Stairs            Wheelchair Mobility    Modified Rankin (Stroke Patients Only)       Balance Overall balance assessment: Needs assistance   Sitting balance-Leahy Scale: Fair       Standing balance-Leahy Scale: Poor                               Pertinent Vitals/Pain Pain Assessment: No/denies pain    Home Living Family/patient expects to be discharged to:: Assisted living               Home Equipment: Walker - 2 wheels Additional Comments: Pt poor historian. Reports he lives in 2-bedroom apartment alone in [place]; son lives in [place]    Prior Function Level of Independence: Needs assistance   Gait / Transfers Assistance Needed: Pt poor historian. Reports indep with all ADLs. When asked about RW, reports he uses one. "The people at the place help me with cooking and cleaning"  Hand Dominance        Extremity/Trunk Assessment   Upper Extremity Assessment Upper Extremity Assessment: Generalized weakness    Lower Extremity Assessment Lower Extremity Assessment: Generalized weakness    Cervical / Trunk Assessment Cervical / Trunk Assessment: Kyphotic  Communication   Communication: HOH  Cognition Arousal/Alertness: Awake/alert Behavior During Therapy: WFL for tasks  assessed/performed Overall Cognitive Status: History of cognitive impairments - at baseline Area of Impairment: Orientation;Attention;Memory;Following commands;Safety/judgement;Awareness;Problem solving                 Orientation Level: Disoriented to;Place;Time;Situation Current Attention Level: Sustained Memory: Decreased short-term memory Following Commands: Follows multi-step commands inconsistently Safety/Judgement: Decreased awareness of safety;Decreased awareness of deficits Awareness: Intellectual Problem Solving: Slow processing;Requires verbal cues;Difficulty sequencing        General Comments      Exercises     Assessment/Plan    PT Assessment Patient needs continued PT services  PT Problem List Decreased strength;Decreased activity tolerance;Decreased balance;Decreased mobility;Decreased cognition;Decreased knowledge of use of DME;Decreased safety awareness       PT Treatment Interventions DME instruction;Gait training;Stair training;Functional mobility training;Therapeutic activities;Therapeutic exercise;Balance training;Patient/family education    PT Goals (Current goals can be found in the Care Plan section)  Acute Rehab PT Goals Patient Stated Goal: Get out of bed PT Goal Formulation: With patient Time For Goal Achievement: 07/07/17 Potential to Achieve Goals: Good    Frequency Min 2X/week   Barriers to discharge        Co-evaluation               AM-PAC PT "6 Clicks" Daily Activity  Outcome Measure Difficulty turning over in bed (including adjusting bedclothes, sheets and blankets)?: Unable Difficulty moving from lying on back to sitting on the side of the bed? : Unable Difficulty sitting down on and standing up from a chair with arms (e.g., wheelchair, bedside commode, etc,.)?: Unable Help needed moving to and from a bed to chair (including a wheelchair)?: A Little Help needed walking in hospital room?: A Lot Help needed climbing 3-5  steps with a railing? : A Lot 6 Click Score: 10    End of Session Equipment Utilized During Treatment: Gait belt Activity Tolerance: Patient tolerated treatment well Patient left: in bed;with bed alarm set;with call bell/phone within reach Nurse Communication: Mobility status PT Visit Diagnosis: Other abnormalities of gait and mobility (R26.89);Muscle weakness (generalized) (M62.81);Repeated falls (R29.6)    Time: [id number] PT Time Calculation (min) (ACUTE ONLY): 18 min   Charges:   PT Evaluation $PT Eval Moderate Complexity: 1 Mod     PT G Codes:       Ina HomesJaclyn Amara Justen, PT, DPT Acute Rehab Services  Pager: [phone]  Malachy ChamberJaclyn L Juliona Vales 06/23/2017, 8:27 AM

## 2017-06-23 NOTE — Progress Notes (Signed)
Progress Note  Patient Name: Lander Eslick Date of Encounter: 06/23/2017  Primary Cardiologist: Tobias Alexander, MD   Subjective   Remains in afib with SVR on tele.  No dizziness, CP, SOB  Inpatient Medications    Scheduled Meds: . aspirin  324 mg Oral Daily  . enoxaparin (LOVENOX) injection  30 mg Subcutaneous Daily  . finasteride  5 mg Oral Daily  . levothyroxine  75 mcg Oral Daily  . multivitamin with minerals  1 tablet Oral Daily  . rOPINIRole  8 mg Oral BID   Continuous Infusions: . sodium chloride 50 mL/hr at 06/23/17 0203   PRN Meds: atropine, hydrALAZINE, morphine injection, nitroGLYCERIN, ondansetron (ZOFRAN) IV, zolpidem   Vital Signs    Vitals:   06/23/17 0010 06/23/17 0438 06/23/17 0740 06/23/17 0851  BP: (!) 129/56 (!) 148/49 (!) 155/53 (!) 132/58  Pulse: (!) 42 61 (!) 53 (!) 50  Resp:  12 18 14   Temp: 97.8 F (36.6 C) 97.6 F (36.4 C) 97.6 F (36.4 C) (!) 97.4 F (36.3 C)  TempSrc: Oral Oral Oral Oral  SpO2:  96% 99% 100%  Weight: 165 lb 9.1 oz (75.1 kg)     Height:        Intake/Output Summary (Last 24 hours) at 06/23/2017 0959 Last data filed at 06/23/2017 0400 Gross per 24 hour  Intake 97.5 ml  Output 425 ml  Net -327.5 ml   Filed Weights   06/22/17 1750 06/23/17 0010  Weight: 160 lb (72.6 kg) 165 lb 9.1 oz (75.1 kg)    Telemetry    sinus bradycardia - Personally Reviewed  ECG    Sinus bradycardia at 56bpm with first degree AV block - Personally Reviewed  Physical Exam   GEN: No acute distress.   Neck: No JVD Cardiac: RRR, no murmurs, rubs, or gallops.  Respiratory: Clear to auscultation bilaterally. GI: Soft, nontender, non-distended  MS: No edema; No deformity. Neuro:  Nonfocal  Psych: Normal affect   Labs    Chemistry Recent Labs  Lab 06/22/17 1754 06/22/17 1759  NA 142 142  K 3.8 3.8  CL 110 109  CO2 21*  --   GLUCOSE 124* 126*  BUN 57* 53*  CREATININE 1.94* 1.70*  CALCIUM 8.7*  --   PROT 5.6*  --     ALBUMIN 3.2*  --   AST 296*  --   ALT 79*  --   ALKPHOS 81  --   BILITOT 1.1  --   GFRNONAA 28*  --   GFRAA 32*  --   ANIONGAP 11  --      Hematology Recent Labs  Lab 06/22/17 1754 06/22/17 1759  WBC 15.5*  --   RBC 3.98*  --   HGB 12.0* 11.6*  HCT 37.1* 34.0*  MCV 93.2  --   MCH 30.2  --   MCHC 32.3  --   RDW 13.2  --   PLT 259  --     Cardiac Enzymes Recent Labs  Lab 06/22/17 2124 06/23/17 0325  TROPONINI 0.52* 0.41*    Recent Labs  Lab 06/22/17 1756  TROPIPOC 0.57*     BNP Recent Labs  Lab 06/22/17 2124  BNP 817.9*     DDimer No results for input(s): DDIMER in the last 168 hours.   Radiology    Ct Head Wo Contrast  Result Date: 06/22/2017 CLINICAL DATA:  Recent fall, altered mental status, hypertension, trauma EXAM: CT HEAD WITHOUT CONTRAST CT CERVICAL SPINE WITHOUT CONTRAST  TECHNIQUE: Multidetector CT imaging of the head and cervical spine was performed following the standard protocol without intravenous contrast. Multiplanar CT image reconstructions of the cervical spine were also generated. COMPARISON:  None. FINDINGS: CT HEAD FINDINGS Brain: Brain atrophy noted with chronic white matter microvascular changes. Remote left parietal infarct with encephalomalacia. No acute intracranial hemorrhage, new mass lesion, new infarction, midline shift, herniation, hydrocephalus. No extra-axial fluid collection. Cisterns are patent. Cerebellar atrophy as well. Vascular: Intracranial atherosclerosis.  No hyperdense vessel. Skull: Normal. Negative for fracture or focal lesion. Sinuses/Orbits: Orbits are symmetric. Minor polypoid mucosal thickening in the right maxillary sinus. No sinus air-fluid level. Other: None. CT CERVICAL SPINE FINDINGS Alignment: Normal. Skull base and vertebrae: No acute fracture. No primary bone lesion or focal pathologic process. Soft tissues and spinal canal: No prevertebral fluid or swelling. No visible canal hematoma. Disc levels: Cervical  degenerative spondylosis at all levels with disc space narrowing, sclerosis and osteophytes. C4-5 and C5-6 are most severe. Multilevel facet arthropathy, worse on the right. No subluxation dislocation. Normal prevertebral soft tissues. Degenerative changes of the C1-2 articulation. Upper chest: Negative. Other: None. IMPRESSION: No acute intracranial abnormality by noncontrast CT. Brain atrophy and chronic white matter microvascular changes with remote left parietal infarct noted. Cervical degenerative spondylosis without acute osseous finding, fracture or malalignment. Electronically Signed   By: Judie PetitM.  Shick M.D.   On: 06/22/2017 19:48   Ct Cervical Spine Wo Contrast  Result Date: 06/22/2017 CLINICAL DATA:  Recent fall, altered mental status, hypertension, trauma EXAM: CT HEAD WITHOUT CONTRAST CT CERVICAL SPINE WITHOUT CONTRAST TECHNIQUE: Multidetector CT imaging of the head and cervical spine was performed following the standard protocol without intravenous contrast. Multiplanar CT image reconstructions of the cervical spine were also generated. COMPARISON:  None. FINDINGS: CT HEAD FINDINGS Brain: Brain atrophy noted with chronic white matter microvascular changes. Remote left parietal infarct with encephalomalacia. No acute intracranial hemorrhage, new mass lesion, new infarction, midline shift, herniation, hydrocephalus. No extra-axial fluid collection. Cisterns are patent. Cerebellar atrophy as well. Vascular: Intracranial atherosclerosis.  No hyperdense vessel. Skull: Normal. Negative for fracture or focal lesion. Sinuses/Orbits: Orbits are symmetric. Minor polypoid mucosal thickening in the right maxillary sinus. No sinus air-fluid level. Other: None. CT CERVICAL SPINE FINDINGS Alignment: Normal. Skull base and vertebrae: No acute fracture. No primary bone lesion or focal pathologic process. Soft tissues and spinal canal: No prevertebral fluid or swelling. No visible canal hematoma. Disc levels: Cervical  degenerative spondylosis at all levels with disc space narrowing, sclerosis and osteophytes. C4-5 and C5-6 are most severe. Multilevel facet arthropathy, worse on the right. No subluxation dislocation. Normal prevertebral soft tissues. Degenerative changes of the C1-2 articulation. Upper chest: Negative. Other: None. IMPRESSION: No acute intracranial abnormality by noncontrast CT. Brain atrophy and chronic white matter microvascular changes with remote left parietal infarct noted. Cervical degenerative spondylosis without acute osseous finding, fracture or malalignment. Electronically Signed   By: Judie PetitM.  Shick M.D.   On: 06/22/2017 19:48   Dg Chest Port 1 View  Result Date: 06/22/2017 CLINICAL DATA:  Bradycardia EXAM: PORTABLE CHEST 1 VIEW COMPARISON:  None. FINDINGS: Heart size upper normal. Negative for heart failure. Atherosclerotic aortic arch. Lungs are clear without infiltrate effusion or edema. IMPRESSION: No active disease. Electronically Signed   By: Marlan Palauharles  Clark M.D.   On: 06/22/2017 18:15    Cardiac Studies   none  Patient Profile     84 y.o. male with a hx of hypertension, CKD, HFpEF, BPH, hypothyroidism, GERD, HLD who  is being seen today for the evaluation of bradycardia at the request of Dr. Rhunette Croft.  Assessment & Plan    New onset atrial fibrillation with SVR with HR initially in the 30's -Pt presented after a fall with afib and bradycardia and hypotension. -hypotension resolved but still bradycardic in the 50's -Clonidine stopped -no back in sinus bradycardia -poor candidate for anticoagulation due to multiple falls recently.   -head CT with old parietal infarct but no bleed  Hypertension  -BP controlled at 132/58 - 155/75mmHg -clonidine and ARB on hold due to syncope with hypotension on admission -ok to restart ARB from cardiac standpoint  Chronic diastolic HF -Normal EF by echo in 2017 at [facility] -appears euvolemic on exam -creatinine was elevated at 1.94 on admit  and now 1.7 -repeat echo pending  Mechanical fall -unclear whether he had syncope - was found on floor near the shower -had a fall last week secondary to OA in his right knee  Elevated troponin -this is in setting of CKD stage 4.   -poor candidate for ischemic workup given advanced age, DNR and dementia -echo pending    For questions or updates, please contact [facility] Please consult [email or web address] for contact info under Cardiology/STEMI.      Signed, Armanda Magic, MD  06/23/2017, 9:59 AM

## 2017-06-23 NOTE — Progress Notes (Signed)
[facility]                                                                              Patient Demographics  Gregory Mcpherson, is a 84 y.o. male, DOB - [birth year], ZOX:[id number]  Admit date - 06/22/2017   Admitting Physician Lorretta Harp, MD  Outpatient Primary MD for the patient is Gordan Payment., MD  Outpatient specialists:   LOS - 1  days   Medical records reviewed and are as summarized below:    Chief Complaint  Patient presents with  . Bradycardia  . Altered Mental Status       Brief summary   Gregory Mcpherson is a 84 y.o. male with medical history significant of hypertension, dCHF, hypothyroidism, depression, CKD-3, depression, who presented with fall and altered mental status.  No focal weakness, patient was found to have new onset atrial fibrillation with slow ventricular response and bradycardia in ED.  Troponin  0.57, CT head negative for acute intracranial abnormality. UA showed ketones but no UTI, chest x-ray negative for pneumonia.    Assessment & Plan    Principal Problem:   Acute metabolic encephalopathy, unclear etiology may have underlying dementia -Still confused, unclear baseline, has acute renal insufficiency -UA negative for UTI, chest x-ray negative for pneumonia, CT head showed no acute intracranial. -Patient was recommended MRI of the brain however patient's son refused in ED -Ammonia level 18, TSH 1.5, check RPR, B12, folate -Follow blood cultures, swallow study, neurochecks  Active Problems: Falls: Possibly multifactorial including acute metabolic encephalopathy, right knee arthritis, worsening renal function, hypotension bradycardia -Once more alert and oriented, will start physical therapy  Dehydration, acute kidney injury on chronic kidney disease stage III -UA showed ketones, creatinine 1.9 at the time of admission, baseline 1.3 on 6/3 -Creatinine improving to 1.7 today, patient placed on gentle hydration.  New onset  atrial fibrillation with slow ventricular response, elevated troponin -TSH 1.5  follow 2D echo -Responded to atropine - Not anticoagulation candidate due to falls, gait instability, may have underlying dementia -Cardiology was consulted   Chronic diastolic CHF -Currently appears to be hypovolemic, continue aspirin, not on diuretics  Essential hypertension -Initially hypotensive in ED, now improving, continue hydralazine as needed  - hold antihypertensives  Transaminitis -Unclear etiology, repeat LFTs, CK to rule out rhabdo - If LFTs still up, will obtain, abd ultrasound  Leukocytosis -No clear signs of infection, UA negative, chest x-ray negative -Follow blood cultures, follow CBC  Code Status: DNR DVT Prophylaxis:  Lovenox  Family Communication: No family member at the bedside   Disposition Plan:   Time Spent in minutes  35 minutes  Procedures:  None  Consultants:   Cardiology  Antimicrobials:      Medications  Scheduled Meds: . aspirin  324 mg Oral Daily  . enoxaparin (LOVENOX) injection  30 mg Subcutaneous Daily  . finasteride  5 mg Oral Daily  . levothyroxine  75 mcg Oral Daily  . multivitamin with minerals  1 tablet Oral Daily  . rOPINIRole  8 mg Oral BID   Continuous Infusions: . sodium chloride 50 mL/hr at 06/23/17 0203   PRN Meds:.atropine,  hydrALAZINE, morphine injection, nitroGLYCERIN, ondansetron (ZOFRAN) IV, zolpidem   Antibiotics   Anti-infectives (From admission, onward)   None        Subjective:   [name] was seen and examined today.  Confused, unable to obtain review of system from the patient.  No fevers or chills.  Heart rate in 50s this morning.  No acute issues overnight  Objective:   Vitals:   06/23/17 0010 06/23/17 0438 06/23/17 0740 06/23/17 0851  BP: (!) 129/56 (!) 148/49 (!) 155/53 (!) 132/58  Pulse: (!) 42 61 (!) 53 (!) 50  Resp:  12 18 14   Temp: 97.8 F (36.6 C) 97.6 F (36.4 C) 97.6 F (36.4 C) (!) 97.4  F (36.3 C)  TempSrc: Oral Oral Oral Oral  SpO2:  96% 99% 100%  Weight: 75.1 kg (165 lb 9.1 oz)     Height:        Intake/Output Summary (Last 24 hours) at 06/23/2017 0912 Last data filed at 06/23/2017 0400 Gross per 24 hour  Intake 97.5 ml  Output 425 ml  Net -327.5 ml     Wt Readings from Last 3 Encounters:  06/23/17 75.1 kg (165 lb 9.1 oz)     Exam  General: Confused  Eyes:  HEENT:    Cardiovascular: S1 S2 auscultated, Regular rate and rhythm.  Respiratory: Clear to auscultation bilaterally, no wheezing, rales or rhonchi  Gastrointestinal: Soft, nontender, nondistended, + bowel sounds  Ext: no pedal edema bilaterally  Neuro: unable to cooperate all 4 extremities spontaneously  Musculoskeletal: No digital cyanosis, clubbing  Skin: No rashes  Psych: Normal affect and demeanor, alert and oriented x3    Data Reviewed:  I have personally reviewed following labs and imaging studies  Micro Results No results found for this or any previous visit (from the past 240 hour(s)).  Radiology Reports Ct Head Wo Contrast  Result Date: 06/22/2017 CLINICAL DATA:  Recent fall, altered mental status, hypertension, trauma EXAM: CT HEAD WITHOUT CONTRAST CT CERVICAL SPINE WITHOUT CONTRAST TECHNIQUE: Multidetector CT imaging of the head and cervical spine was performed following the standard protocol without intravenous contrast. Multiplanar CT image reconstructions of the cervical spine were also generated. COMPARISON:  None. FINDINGS: CT HEAD FINDINGS Brain: Brain atrophy noted with chronic white matter microvascular changes. Remote left parietal infarct with encephalomalacia. No acute intracranial hemorrhage, new mass lesion, new infarction, midline shift, herniation, hydrocephalus. No extra-axial fluid collection. Cisterns are patent. Cerebellar atrophy as well. Vascular: Intracranial atherosclerosis.  No hyperdense vessel. Skull: Normal. Negative for fracture or focal lesion.  Sinuses/Orbits: Orbits are symmetric. Minor polypoid mucosal thickening in the right maxillary sinus. No sinus air-fluid level. Other: None. CT CERVICAL SPINE FINDINGS Alignment: Normal. Skull base and vertebrae: No acute fracture. No primary bone lesion or focal pathologic process. Soft tissues and spinal canal: No prevertebral fluid or swelling. No visible canal hematoma. Disc levels: Cervical degenerative spondylosis at all levels with disc space narrowing, sclerosis and osteophytes. C4-5 and C5-6 are most severe. Multilevel facet arthropathy, worse on the right. No subluxation dislocation. Normal prevertebral soft tissues. Degenerative changes of the C1-2 articulation. Upper chest: Negative. Other: None. IMPRESSION: No acute intracranial abnormality by noncontrast CT. Brain atrophy and chronic white matter microvascular changes with remote left parietal infarct noted. Cervical degenerative spondylosis without acute osseous finding, fracture or malalignment. Electronically Signed   By: Judie Petit.  Shick M.D.   On: 06/22/2017 19:48   Ct Cervical Spine Wo Contrast  Result Date: 06/22/2017 CLINICAL DATA:  Recent fall,  altered mental status, hypertension, trauma EXAM: CT HEAD WITHOUT CONTRAST CT CERVICAL SPINE WITHOUT CONTRAST TECHNIQUE: Multidetector CT imaging of the head and cervical spine was performed following the standard protocol without intravenous contrast. Multiplanar CT image reconstructions of the cervical spine were also generated. COMPARISON:  None. FINDINGS: CT HEAD FINDINGS Brain: Brain atrophy noted with chronic white matter microvascular changes. Remote left parietal infarct with encephalomalacia. No acute intracranial hemorrhage, new mass lesion, new infarction, midline shift, herniation, hydrocephalus. No extra-axial fluid collection. Cisterns are patent. Cerebellar atrophy as well. Vascular: Intracranial atherosclerosis.  No hyperdense vessel. Skull: Normal. Negative for fracture or focal lesion.  Sinuses/Orbits: Orbits are symmetric. Minor polypoid mucosal thickening in the right maxillary sinus. No sinus air-fluid level. Other: None. CT CERVICAL SPINE FINDINGS Alignment: Normal. Skull base and vertebrae: No acute fracture. No primary bone lesion or focal pathologic process. Soft tissues and spinal canal: No prevertebral fluid or swelling. No visible canal hematoma. Disc levels: Cervical degenerative spondylosis at all levels with disc space narrowing, sclerosis and osteophytes. C4-5 and C5-6 are most severe. Multilevel facet arthropathy, worse on the right. No subluxation dislocation. Normal prevertebral soft tissues. Degenerative changes of the C1-2 articulation. Upper chest: Negative. Other: None. IMPRESSION: No acute intracranial abnormality by noncontrast CT. Brain atrophy and chronic white matter microvascular changes with remote left parietal infarct noted. Cervical degenerative spondylosis without acute osseous finding, fracture or malalignment. Electronically Signed   By: Judie Petit.  Shick M.D.   On: 06/22/2017 19:48   Dg Chest Port 1 View  Result Date: 06/22/2017 CLINICAL DATA:  Bradycardia EXAM: PORTABLE CHEST 1 VIEW COMPARISON:  None. FINDINGS: Heart size upper normal. Negative for heart failure. Atherosclerotic aortic arch. Lungs are clear without infiltrate effusion or edema. IMPRESSION: No active disease. Electronically Signed   By: Marlan Palau M.D.   On: 06/22/2017 18:15    Lab Data:  CBC: Recent Labs  Lab 06/22/17 1754 06/22/17 1759  WBC 15.5*  --   NEUTROABS 13.7*  --   HGB 12.0* 11.6*  HCT 37.1* 34.0*  MCV 93.2  --   PLT 259  --    Basic Metabolic Panel: Recent Labs  Lab 06/22/17 1754 06/22/17 1759  NA 142 142  K 3.8 3.8  CL 110 109  CO2 21*  --   GLUCOSE 124* 126*  BUN 57* 53*  CREATININE 1.94* 1.70*  CALCIUM 8.7*  --   MG 2.3  --   PHOS 3.9  --    GFR: Estimated Creatinine Clearance: 27.4 mL/min (A) (by C-G formula based on SCr of 1.7 mg/dL (H)). Liver  Function Tests: Recent Labs  Lab 06/22/17 1754  AST 296*  ALT 79*  ALKPHOS 81  BILITOT 1.1  PROT 5.6*  ALBUMIN 3.2*   No results for input(s): LIPASE, AMYLASE in the last 168 hours. Recent Labs  Lab 06/22/17 2124  AMMONIA 18   Coagulation Profile: Recent Labs  Lab 06/22/17 1754  INR 1.17   Cardiac Enzymes: Recent Labs  Lab 06/22/17 2124 06/23/17 0325  TROPONINI 0.52* 0.41*   BNP (last 3 results) No results for input(s): PROBNP in the last 8760 hours. HbA1C: Recent Labs    06/23/17 0325  HGBA1C 5.7*   CBG: Recent Labs  Lab 06/22/17 1743  GLUCAP 121*   Lipid Profile: Recent Labs    06/23/17 0325  CHOL 143  HDL 61  LDLCALC 70  TRIG 60  CHOLHDL 2.3   Thyroid Function Tests: Recent Labs    06/23/17 0325  TSH  1.509   Anemia Panel: No results for input(s): VITAMINB12, FOLATE, FERRITIN, TIBC, IRON, RETICCTPCT in the last 72 hours. Urine analysis:    Component Value Date/Time   COLORURINE YELLOW 06/22/2017 2202   APPEARANCEUR HAZY (A) 06/22/2017 2202   LABSPEC 1.018 06/22/2017 2202   PHURINE 5.0 06/22/2017 2202   GLUCOSEU NEGATIVE 06/22/2017 2202   HGBUR LARGE (A) 06/22/2017 2202   BILIRUBINUR NEGATIVE 06/22/2017 2202   KETONESUR 5 (A) 06/22/2017 2202   PROTEINUR 100 (A) 06/22/2017 2202   NITRITE NEGATIVE 06/22/2017 2202   LEUKOCYTESUR NEGATIVE 06/22/2017 2202     Ripudeep Rai M.D. [facility] 06/23/2017, 9:12 AM  Pager: [phone] Between 7am to 7pm - call Pager - [phone]  After 7pm go to [email or web address] - password [email or web address]  Call night coverage person covering after 7pm

## 2017-06-24 ENCOUNTER — Inpatient Hospital Stay (HOSPITAL_COMMUNITY): Payer: Medicare Other

## 2017-06-24 ENCOUNTER — Encounter (HOSPITAL_COMMUNITY)
Admission: EM | Disposition: A | Discharge status: Skilled Nursing Facility | Payer: Self-pay | Source: Home / Self Care | Attending: Internal Medicine

## 2017-06-24 ENCOUNTER — Other Ambulatory Visit: Payer: Self-pay | Admitting: Physician Assistant

## 2017-06-24 ENCOUNTER — Other Ambulatory Visit: Payer: Self-pay

## 2017-06-24 DIAGNOSIS — G9341 Metabolic encephalopathy: Secondary | ICD-10-CM

## 2017-06-24 DIAGNOSIS — I509 Heart failure, unspecified: Secondary | ICD-10-CM

## 2017-06-24 DIAGNOSIS — I361 Nonrheumatic tricuspid (valve) insufficiency: Secondary | ICD-10-CM

## 2017-06-24 DIAGNOSIS — R001 Bradycardia, unspecified: Secondary | ICD-10-CM

## 2017-06-24 DIAGNOSIS — N183 Chronic kidney disease, stage 3 (moderate): Secondary | ICD-10-CM

## 2017-06-24 DIAGNOSIS — N179 Acute kidney failure, unspecified: Secondary | ICD-10-CM

## 2017-06-24 LAB — COMPREHENSIVE METABOLIC PANEL
ALBUMIN: 3.3 g/dL — AB (ref 3.5–5.0)
ALT: 156 U/L — ABNORMAL HIGH (ref 17–63)
AST: 401 U/L — AB (ref 15–41)
Alkaline Phosphatase: 86 U/L (ref 38–126)
Anion gap: 7 (ref 5–15)
BUN: 47 mg/dL — AB (ref 6–20)
CO2: 21 mmol/L — AB (ref 22–32)
Calcium: 8.7 mg/dL — ABNORMAL LOW (ref 8.9–10.3)
Chloride: 114 mmol/L — ABNORMAL HIGH (ref 101–111)
Creatinine, Ser: 1.56 mg/dL — ABNORMAL HIGH (ref 0.61–1.24)
GFR calc Af Amer: 42 mL/min — ABNORMAL LOW (ref >60)
GFR calc non Af Amer: 36 mL/min — ABNORMAL LOW (ref >60)
GLUCOSE: 92 mg/dL (ref 65–99)
POTASSIUM: 3.6 mmol/L (ref 3.5–5.1)
SODIUM: 142 mmol/L (ref 135–145)
Total Bilirubin: 0.9 mg/dL (ref 0.3–1.2)
Total Protein: 6 g/dL — ABNORMAL LOW (ref 6.5–8.1)

## 2017-06-24 LAB — URINE CULTURE: Culture: NO GROWTH

## 2017-06-24 LAB — HEPATITIS PANEL, ACUTE
Hep A IgM: NEGATIVE
Hep B C IgM: NEGATIVE
Hepatitis B Surface Ag: NEGATIVE

## 2017-06-24 LAB — CBC
HCT: 38.7 % — ABNORMAL LOW (ref 39.0–52.0)
HEMOGLOBIN: 12.8 g/dL — AB (ref 13.0–17.0)
MCH: 30.5 pg (ref 26.0–34.0)
MCHC: 33.1 g/dL (ref 30.0–36.0)
MCV: 92.1 fL (ref 78.0–100.0)
Platelets: 276 10*3/uL (ref 150–400)
RBC: 4.2 MIL/uL — ABNORMAL LOW (ref 4.22–5.81)
RDW: 13.2 % (ref 11.5–15.5)
WBC: 14.7 10*3/uL — AB (ref 4.0–10.5)

## 2017-06-24 LAB — ECHOCARDIOGRAM COMPLETE
HEIGHTINCHES: 70 in
WEIGHTICAEL: 2627.2 [oz_av]

## 2017-06-24 LAB — RPR: RPR Ser Ql: NONREACTIVE

## 2017-06-24 LAB — HIV ANTIBODY (ROUTINE TESTING W REFLEX): HIV SCREEN 4TH GENERATION: NONREACTIVE

## 2017-06-24 SURGERY — LEFT HEART CATH AND CORONARY ANGIOGRAPHY
Anesthesia: LOCAL

## 2017-06-24 MED ORDER — ROPINIROLE HCL 1 MG PO TABS: 2.0000 mg | ORAL_TABLET | Freq: Two times a day (BID) | ORAL | Status: DC

## 2017-06-24 MED ORDER — ROPINIROLE HCL 1 MG PO TABS
1.0000 mg | ORAL_TABLET | Freq: Once | ORAL | Status: AC
  Administered 2017-06-24: 1 mg via ORAL
  Filled 2017-06-24 (×2): qty 1

## 2017-06-24 MED ORDER — ROPINIROLE HCL 1 MG PO TABS: 2.0000 mg | ORAL_TABLET | Freq: Every evening | ORAL | Status: DC

## 2017-06-24 MED ORDER — ROPINIROLE HCL 1 MG PO TABS
2.0000 mg | ORAL_TABLET | Freq: Two times a day (BID) | ORAL | Status: DC
  Administered 2017-06-24 – 2017-06-25 (×2): 2 mg via ORAL
  Filled 2017-06-24 (×2): qty 2

## 2017-06-24 NOTE — Clinical Social Work Note (Addendum)
[facility] tomorrow.  Charlynn CourtSarah Bani Gianfrancesco, CSW [phone]  3:12 pm CSW uploaded requested documents into [employer] for [facility] review.  Charlynn CourtSarah Jenesys Casseus, CSW [phone]  3:17 pm [facility] obtained: [id number] E. Expires 07/24/2017. Left voicemail for SNF admissions coordinator to notify.  Charlynn CourtSarah Samer Dutton, CSW [phone]  3:29 pm [facility] that [facility]. CSW started a verbal authorization and refaxed information.  Charlynn CourtSarah Brittanny Levenhagen, CSW [phone]

## 2017-06-24 NOTE — Plan of Care (Signed)
  Problem: Elimination: Goal: Will not experience complications related to bowel motility Note:  No urge Goal: Will not experience complications related to urinary retention Note:  Condom cath on with no complaints   Problem: Pain Managment: Goal: General experience of comfort will improve Note:  Tylenol given   Problem: Safety: Goal: Ability to remain free from injury will improve Note:  Low bed   Problem: Skin Integrity: Goal: Risk for impaired skin integrity will decrease Note:  Right elbow abrasion

## 2017-06-24 NOTE — Progress Notes (Addendum)
[facility]                                                                              Patient Demographics  Gregory Mcpherson, is a 84 y.o. male, DOB - Jan 23, 1920, ZHY:[id number]  Admit date - 06/22/2017   Admitting Physician Lorretta Harp, MD  Outpatient Primary MD for the patient is Gordan Payment., MD  Outpatient specialists:   LOS - 2  days   Medical records reviewed and are as summarized below:    Chief Complaint  Patient presents with  . Bradycardia  . Altered Mental Status       Brief summary   Gregory Mcpherson is a 84 y.o. male with medical history significant of hypertension, dCHF, hypothyroidism, depression, CKD-3, depression, who presented with fall and altered mental status.  No focal weakness, patient was found to have new onset atrial fibrillation with slow ventricular response and bradycardia in ED.  Troponin  0.57, CT head negative for acute intracranial abnormality. UA showed ketones but no UTI, chest x-ray negative for pneumonia.    Assessment & Plan      Encephalopathy,  -suspect polypharmacy/acute kidney injury contributing, could have underlying senile dementia too, son reports short-term memory deficits which are long-standing -UA negative for UTI, chest x-ray negative for pneumonia, CT head showed no acute intracranial. -Ammonia level 18, TSH 1.5, B-12 and folate are unremarkable -.Awake, alert, oriented to self and partly to place, close to his baseline now,  -Requip dose decreased, has some movement disorder, on exam some parkinsonian features -PT/OT  Fall/rhabdomyolysis -Gentle IV fluids -Supportive care, was hypotensive on admission, now resolved -clonidine stopped -troponin was mildly elevated on admission, unclear significance in the setting of CK D, seen by cardiology, follow-up echo  Acute kidney injury on chronic kidney disease stage III/rhabdomyolysis - creatinine 1.9 at the time of admission, baseline 1.3 on  6/3 -Creatinine improving to 1.5 today, -continue gentle hydration, monitor CK -ARB on hold  New onset atrial fibrillation with slow ventricular response, elevated troponin -TSH 1.5, echo done, results pending -Responded to atropine by EMS when he was hypotensive and bradycardic - Not anticoagulation candidate due to falls, gait instability, may have underlying dementia -seen by cardiology earlier this admission  Chronic diastolic CHF -Currently appears to be hypovolemic, continue aspirin, not on diuretics -gentle IV fluids for now  Essential hypertension -Initially hypotensive in ED, now improving, continue hydralazine as needed  - hold antihypertensives  Transaminitis -due to rhabdomyolysis, right upper quadrant ultrasound unremarkable, bili is normal -Monitor  Leukocytosis -No clear signs of infection, UA negative, chest x-ray negative -blood cultures negative, trend WBC  Movement d/o -on requip -has increased tone, rigidity, tremors, shuffling gait, ? Some parkinsonian features  Code Status: DNR DVT Prophylaxis:  Lovenox  Family Communication: No family member at the bedside, called and discussed with son   Disposition Plan:   Time Spent in minutes  35 minutes  Procedures:  None  Consultants:   Cardiology  Antimicrobials:      Medications  Scheduled Meds: . aspirin  81 mg Oral Daily  . enoxaparin (LOVENOX) injection  30 mg Subcutaneous Daily  .  finasteride  5 mg Oral Daily  . levothyroxine  75 mcg Oral Daily  . multivitamin with minerals  1 tablet Oral Daily  . rOPINIRole  2 mg Oral BID   Continuous Infusions: . sodium chloride 50 mL/hr at 06/23/17 2005   PRN Meds:.acetaminophen, atropine, hydrALAZINE, nitroGLYCERIN, ondansetron (ZOFRAN) IV, zolpidem   Antibiotics   Anti-infectives (From admission, onward)   None        Subjective:   -feels okay, no complaints  Objective:   Vitals:   06/23/17 1626 06/23/17 1922 06/24/17 0005  06/24/17 0500  BP: (!) 156/55 (!) 171/71 (!) 142/76 (!) 172/76  Pulse: (!) 53 71 76 77  Resp: 20 16 18 18   Temp: 98 F (36.7 C) 98 F (36.7 C) 98 F (36.7 C) 98 F (36.7 C)  TempSrc: Oral  Oral Oral  SpO2: 100% 100% 100% 98%  Weight:    74.5 kg (164 lb 3.2 oz)  Height:        Intake/Output Summary (Last 24 hours) at 06/24/2017 1250 Last data filed at 06/24/2017 1010 Gross per 24 hour  Intake 0 ml  Output 2050 ml  Net -2050 ml     Wt Readings from Last 3 Encounters:  06/24/17 74.5 kg (164 lb 3.2 oz)     Exam Gen: Awake, Alert, oriented to self, partly to time and not oriented to place HEENT: PERRLA, Neck supple, no JVD Lungs: Good air movement bilaterally, CTAB CVS: RRR,No Gallops,Rubs or new Murmurs Abd: soft, Non tender, non distended, BS present Extremities: No Cyanosis, Clubbing or edema Skin: no new rashes Neuro: Moves all extremities, no localizing signs, short-term memory deficits noted    Data Reviewed:  I have personally reviewed following labs and imaging studies  Micro Results Recent Results (from the past 240 hour(s))  Culture, blood (Routine X 2) w Reflex to ID Panel     Status: None (Preliminary result)   Collection Time: 06/22/17  9:24 PM  Result Value Ref Range Status   Specimen Description BLOOD SITE NOT SPECIFIED  Final   Special Requests   Final    BOTTLES DRAWN AEROBIC AND ANAEROBIC Blood Culture results may not be optimal due to an inadequate volume of blood received in culture bottles   Culture   Final    NO GROWTH 1 DAY Performed at [facility], [address], [place], [place] [address]    Report Status PENDING  Incomplete  Culture, blood (Routine X 2) w Reflex to ID Panel     Status: None (Preliminary result)   Collection Time: 06/22/17  9:24 PM  Result Value Ref Range Status   Specimen Description BLOOD RIGHT HAND  Final   Special Requests   Final    BOTTLES DRAWN AEROBIC AND ANAEROBIC Blood Culture results may not be optimal  due to an inadequate volume of blood received in culture bottles   Culture   Final    NO GROWTH 1 DAY Performed at [facility], [address] [place], [place] [address]    Report Status PENDING  Incomplete  Urine Culture     Status: None   Collection Time: 06/22/17 10:02 PM  Result Value Ref Range Status   Specimen Description URINE, RANDOM  Final   Special Requests NONE  Final   Culture   Final    NO GROWTH Performed at [facility], [address], [place], [place] [address]    Report Status 06/24/2017 FINAL  Final  Radiology Reports Ct Head Wo Contrast  Result Date: 06/22/2017 CLINICAL DATA:  Recent fall, altered mental status, hypertension, trauma EXAM: CT HEAD WITHOUT CONTRAST CT CERVICAL SPINE WITHOUT CONTRAST TECHNIQUE: Multidetector CT imaging of the head and cervical spine was performed following the standard protocol without intravenous contrast. Multiplanar CT image reconstructions of the cervical spine were also generated. COMPARISON:  None. FINDINGS: CT HEAD FINDINGS Brain: Brain atrophy noted with chronic white matter microvascular changes. Remote left parietal infarct with encephalomalacia. No acute intracranial hemorrhage, new mass lesion, new infarction, midline shift, herniation, hydrocephalus. No extra-axial fluid collection. Cisterns are patent. Cerebellar atrophy as well. Vascular: Intracranial atherosclerosis.  No hyperdense vessel. Skull: Normal. Negative for fracture or focal lesion. Sinuses/Orbits: Orbits are symmetric. Minor polypoid mucosal thickening in the right maxillary sinus. No sinus air-fluid level. Other: None. CT CERVICAL SPINE FINDINGS Alignment: Normal. Skull base and vertebrae: No acute fracture. No primary bone lesion or focal pathologic process. Soft tissues and spinal canal: No prevertebral fluid or swelling. No visible canal hematoma. Disc levels: Cervical degenerative spondylosis at all levels with disc space narrowing, sclerosis and  osteophytes. C4-5 and C5-6 are most severe. Multilevel facet arthropathy, worse on the right. No subluxation dislocation. Normal prevertebral soft tissues. Degenerative changes of the C1-2 articulation. Upper chest: Negative. Other: None. IMPRESSION: No acute intracranial abnormality by noncontrast CT. Brain atrophy and chronic white matter microvascular changes with remote left parietal infarct noted. Cervical degenerative spondylosis without acute osseous finding, fracture or malalignment. Electronically Signed   By: Judie Petit.  Shick M.D.   On: 06/22/2017 19:48   Ct Cervical Spine Wo Contrast  Result Date: 06/22/2017 CLINICAL DATA:  Recent fall, altered mental status, hypertension, trauma EXAM: CT HEAD WITHOUT CONTRAST CT CERVICAL SPINE WITHOUT CONTRAST TECHNIQUE: Multidetector CT imaging of the head and cervical spine was performed following the standard protocol without intravenous contrast. Multiplanar CT image reconstructions of the cervical spine were also generated. COMPARISON:  None. FINDINGS: CT HEAD FINDINGS Brain: Brain atrophy noted with chronic white matter microvascular changes. Remote left parietal infarct with encephalomalacia. No acute intracranial hemorrhage, new mass lesion, new infarction, midline shift, herniation, hydrocephalus. No extra-axial fluid collection. Cisterns are patent. Cerebellar atrophy as well. Vascular: Intracranial atherosclerosis.  No hyperdense vessel. Skull: Normal. Negative for fracture or focal lesion. Sinuses/Orbits: Orbits are symmetric. Minor polypoid mucosal thickening in the right maxillary sinus. No sinus air-fluid level. Other: None. CT CERVICAL SPINE FINDINGS Alignment: Normal. Skull base and vertebrae: No acute fracture. No primary bone lesion or focal pathologic process. Soft tissues and spinal canal: No prevertebral fluid or swelling. No visible canal hematoma. Disc levels: Cervical degenerative spondylosis at all levels with disc space narrowing, sclerosis and  osteophytes. C4-5 and C5-6 are most severe. Multilevel facet arthropathy, worse on the right. No subluxation dislocation. Normal prevertebral soft tissues. Degenerative changes of the C1-2 articulation. Upper chest: Negative. Other: None. IMPRESSION: No acute intracranial abnormality by noncontrast CT. Brain atrophy and chronic white matter microvascular changes with remote left parietal infarct noted. Cervical degenerative spondylosis without acute osseous finding, fracture or malalignment. Electronically Signed   By: Judie Petit.  Shick M.D.   On: 06/22/2017 19:48   US Abdomen Complete  Result Date: 06/23/2017 CLINICAL DATA:  Transaminitis. EXAM: ABDOMEN ULTRASOUND COMPLETE COMPARISON:  None. FINDINGS: Gallbladder: Small volume sludge in the gallbladder. No gallstones or wall thickening visualized. No sonographic Murphy sign noted by sonographer. Common bile duct: Diameter: 5 mm Liver: No focal lesion identified, though the left lobe is small and suboptimally evaluated.  Within normal limits in parenchymal echogenicity. Portal vein is patent on color Doppler imaging with normal direction of blood flow towards the liver. IVC: Not well visualized. Pancreas: Obscured by bowel gas. Spleen: Size and appearance within normal limits. Right Kidney: Length: 11.8 cm. Echogenicity within normal limits. No hydronephrosis. Cysts measure 2.5 cm and 2.7 cm. Left Kidney: Length: 10.9 cm. Echogenicity within normal limits. No hydronephrosis. 3.5 cm cyst. Abdominal aorta: Partially obscured by bowel gas. 2.7 cm diameter proximally. Other findings: None. IMPRESSION: 1. Gallbladder sludge without gallstones or biliary dilatation. 2. No significant liver abnormality identified. 3. Bilateral renal cysts. Electronically Signed   By: Sebastian AcheAllen  Grady M.D.   On: 06/23/2017 11:16   Dg Chest Port 1 View  Result Date: 06/22/2017 CLINICAL DATA:  Bradycardia EXAM: PORTABLE CHEST 1 VIEW COMPARISON:  None. FINDINGS: Heart size upper normal. Negative  for heart failure. Atherosclerotic aortic arch. Lungs are clear without infiltrate effusion or edema. IMPRESSION: No active disease. Electronically Signed   By: Marlan Palauharles  Clark M.D.   On: 06/22/2017 18:15    Lab Data:  CBC: Recent Labs  Lab 06/22/17 1754 06/22/17 1759 06/23/17 1257 06/24/17 0504  WBC 15.5*  --  15.5* 14.7*  NEUTROABS 13.7*  --   --   --   HGB 12.0* 11.6* 11.1* 12.8*  HCT 37.1* 34.0* 33.7* 38.7*  MCV 93.2  --  91.3 92.1  PLT 259  --  253 276   Basic Metabolic Panel: Recent Labs  Lab 06/22/17 1754 06/22/17 1759 06/24/17 0504  NA 142 142 142  K 3.8 3.8 3.6  CL 110 109 114*  CO2 21*  --  21*  GLUCOSE 124* 126* 92  BUN 57* 53* 47*  CREATININE 1.94* 1.70* 1.56*  CALCIUM 8.7*  --  8.7*  MG 2.3  --   --   PHOS 3.9  --   --    GFR: Estimated Creatinine Clearance: 29.9 mL/min (A) (by C-G formula based on SCr of 1.56 mg/dL (H)). Liver Function Tests: Recent Labs  Lab 06/22/17 1754 06/23/17 1257 06/24/17 0504  AST 296* 374* 401*  ALT 79* 118* 156*  ALKPHOS 81 78 86  BILITOT 1.1 1.1 0.9  PROT 5.6* 5.3* 6.0*  ALBUMIN 3.2* 3.1* 3.3*   No results for input(s): LIPASE, AMYLASE in the last 168 hours. Recent Labs  Lab 06/22/17 2124  AMMONIA 18   Coagulation Profile: Recent Labs  Lab 06/22/17 1754  INR 1.17   Cardiac Enzymes: Recent Labs  Lab 06/22/17 2124 06/23/17 0325 06/23/17 0834 06/23/17 1257  CKTOTAL  --   --   --  16,10911,692*  TROPONINI 0.52* 0.41* 0.31*  --    BNP (last 3 results) No results for input(s): PROBNP in the last 8760 hours. HbA1C: Recent Labs    06/23/17 0325  HGBA1C 5.7*   CBG: Recent Labs  Lab 06/22/17 1743  GLUCAP 121*   Lipid Profile: Recent Labs    06/23/17 0325  CHOL 143  HDL 61  LDLCALC 70  TRIG 60  CHOLHDL 2.3   Thyroid Function Tests: Recent Labs    06/23/17 0325  TSH 1.509   Anemia Panel: Recent Labs    06/23/17 1257  VITAMINB12 663  FOLATE 36.0   Urine analysis:    Component Value  Date/Time   COLORURINE YELLOW 06/22/2017 2202   APPEARANCEUR HAZY (A) 06/22/2017 2202   LABSPEC 1.018 06/22/2017 2202   PHURINE 5.0 06/22/2017 2202   GLUCOSEU NEGATIVE 06/22/2017 2202   HGBUR LARGE (  A) 06/22/2017 2202   BILIRUBINUR NEGATIVE 06/22/2017 2202   KETONESUR 5 (A) 06/22/2017 2202   PROTEINUR 100 (A) 06/22/2017 2202   NITRITE NEGATIVE 06/22/2017 2202   LEUKOCYTESUR NEGATIVE 06/22/2017 2202     Zannie Cove M.D. [facility] 06/24/2017, 12:50 PM  Page via  [email or web address] - password [email or web address]  Call night coverage person covering after 7pm

## 2017-06-24 NOTE — Plan of Care (Signed)
  Problem: Coping: Goal: Level of anxiety will decrease Outcome: Progressing   Problem: Elimination: Goal: Will not experience complications related to bowel motility Outcome: Progressing   Problem: Pain Managment: Goal: General experience of comfort will improve Outcome: Progressing   

## 2017-06-24 NOTE — Progress Notes (Signed)
Patient is alert and forgetful and jerky with family at beside. Requip Po meds are being adjusted. Gave Tylenol for discomfort.

## 2017-06-24 NOTE — Progress Notes (Signed)
Minimally elevated trop with flat trend and 2D echo with normal LVF with no wall motion abnormalities.  Trop elevatio c/w rhabdo/acute on CKD from fall.  CPK markedly elevated way out of proportion to trop and no consistent with cardiac source.  No further cardiac workup recommended at this time.  Will get an event monitor as outpt.  Will sign off.  Please call with any questions

## 2017-06-24 NOTE — Progress Notes (Signed)
  Echocardiogram 2D Echocardiogram has been performed.  Keyla Milone T Dezaria Methot 06/24/2017, 1:05 PM

## 2017-06-25 LAB — COMPREHENSIVE METABOLIC PANEL
ALT: 139 U/L — AB (ref 17–63)
AST: 196 U/L — AB (ref 15–41)
Albumin: 3.1 g/dL — ABNORMAL LOW (ref 3.5–5.0)
Alkaline Phosphatase: 87 U/L (ref 38–126)
Anion gap: 10 (ref 5–15)
BUN: 29 mg/dL — ABNORMAL HIGH (ref 6–20)
CO2: 22 mmol/L (ref 22–32)
Calcium: 8.7 mg/dL — ABNORMAL LOW (ref 8.9–10.3)
Chloride: 111 mmol/L (ref 101–111)
Creatinine, Ser: 1.24 mg/dL (ref 0.61–1.24)
GFR calc non Af Amer: 48 mL/min — ABNORMAL LOW (ref >60)
GFR, EST AFRICAN AMERICAN: 56 mL/min — AB (ref >60)
GLUCOSE: 112 mg/dL — AB (ref 65–99)
POTASSIUM: 3.4 mmol/L — AB (ref 3.5–5.1)
Sodium: 143 mmol/L (ref 135–145)
TOTAL PROTEIN: 5.5 g/dL — AB (ref 6.5–8.1)
Total Bilirubin: 1.1 mg/dL (ref 0.3–1.2)

## 2017-06-25 LAB — CBC
HEMATOCRIT: 36.2 % — AB (ref 39.0–52.0)
Hemoglobin: 12.2 g/dL — ABNORMAL LOW (ref 13.0–17.0)
MCH: 30.6 pg (ref 26.0–34.0)
MCHC: 33.7 g/dL (ref 30.0–36.0)
MCV: 90.7 fL (ref 78.0–100.0)
Platelets: 263 10*3/uL (ref 150–400)
RBC: 3.99 MIL/uL — AB (ref 4.22–5.81)
RDW: 13.2 % (ref 11.5–15.5)
WBC: 12.1 10*3/uL — AB (ref 4.0–10.5)

## 2017-06-25 LAB — CK: Total CK: 2418 U/L — ABNORMAL HIGH (ref 49–397)

## 2017-06-25 MED ORDER — CALAMINE EX LOTN
TOPICAL_LOTION | CUTANEOUS | Status: DC | PRN
  Filled 2017-06-25: qty 177

## 2017-06-25 MED ORDER — ZOLPIDEM TARTRATE 5 MG PO TABS
5.0000 mg | ORAL_TABLET | Freq: Once | ORAL | Status: AC
  Administered 2017-06-25: 5 mg via ORAL
  Filled 2017-06-25: qty 1

## 2017-06-25 MED ORDER — ROPINIROLE HCL 1 MG PO TABS
2.0000 mg | ORAL_TABLET | ORAL | Status: DC
  Administered 2017-06-26: 2 mg via ORAL
  Filled 2017-06-25: qty 2

## 2017-06-25 MED ORDER — LEVOTHYROXINE SODIUM 75 MCG PO TABS
75.0000 ug | ORAL_TABLET | Freq: Every day | ORAL | Status: DC
  Administered 2017-06-25 – 2017-06-26 (×2): 75 ug via ORAL
  Filled 2017-06-25 (×2): qty 1

## 2017-06-25 MED ORDER — ROPINIROLE HCL 1 MG PO TABS: 2.0000 mg | ORAL_TABLET | Freq: Two times a day (BID) | ORAL | Status: DC

## 2017-06-25 MED ORDER — CALAMINE EX LOTN
1.0000 "application " | TOPICAL_LOTION | Freq: Two times a day (BID) | CUTANEOUS | Status: DC
  Administered 2017-06-25 (×2): 1 via TOPICAL
  Filled 2017-06-25: qty 177

## 2017-06-25 MED ORDER — ROPINIROLE HCL 1 MG PO TABS
4.0000 mg | ORAL_TABLET | Freq: Every day | ORAL | Status: DC
  Administered 2017-06-25: 4 mg via ORAL
  Filled 2017-06-25: qty 4

## 2017-06-25 NOTE — Progress Notes (Signed)
[facility]                                                                              Patient Demographics  Gregory Mcpherson, is a 84 y.o. male, DOB - 11/15/1920, ZOX:[id number]  Admit date - 06/22/2017   Admitting Physician Lorretta HarpXilin Niu, Mcpherson  Outpatient Primary Mcpherson for the patient is Gregory Mcpherson  Outpatient specialists:   LOS - 3  days   Medical records reviewed and are as summarized below:    Chief Complaint  Patient presents with  . Bradycardia  . Altered Mental Status       Brief summary   Gregory Mcpherson is a 84 y.o. male with medical history significant of hypertension, dCHF, movement disorder on Requip,hypothyroidism, depression, CKD-3, depression, who presented with fall and altered mental status.  No focal weakness, patient was found to have new onset atrial fibrillation with slow ventricular response and bradycardia in ED.  Troponin  0.57, CT head negative for acute intracranial abnormality. UA showed ketones but no UTI, chest x-ray negative for pneumonia.    Assessment & Plan      Encephalopathy,  -improved -suspect polypharmacy/acute kidney injury contributing, could have underlying senile dementia too, son reports short-term memory deficits which are long-standing -UA negative for UTI, chest x-ray negative for pneumonia, CT head showed no acute intracranial. -Ammonia level 18, TSH 1.5, B-12 and folate are unremarkable -he is alert, awake, oriented to self place and partly to time, mostly back to his baseline now -Requip dose decreased, has some movement disorder, on exam some parkinsonian features, due to increased tremors/jerks will increase p.m. Dose of Requip to baseline 4 mg and keep a.m. Dose at 2 mg -physical therapy consulted, SNF recommended for rehabilitation  Movement disorder -patient carries a diagnosis of periodic movement disorder, followed by a neurologist in [place], has been maintained on high-dose Requip -On exam he has  mild clonus, increased tone, shuffling gait, unclear if all this is part of periodic movement disorder or whether he could have some parkinsonian features -I called and consulted with Dr. Wilford CornerArora from neurology who felt he would be better served by outpatient follow-up with his primary neurologist or with Dr. Lurena Joinerebecca Tat from [facility] -d/w Family, sent referral to Dr.Tat -slightly decreased dose of Requip as above  Fall/rhabdomyolysis -improved with hydration -Supportive care, was hypotensive on admission, now resolved -clonidine stopped -troponin was mildly elevated on admission, unclear significance in the setting of CK D, seen by cardiology, echo with preserved EF and no wall motion abnormalities  Acute kidney injury on chronic kidney disease stage III/rhabdomyolysis - creatinine 1.9 at the time of admission, baseline 1.3 on 6/3 -Creatinine improving to 1.2, following gentle hydration -CK is significantly improved as well -Stop IV fluids now -ARB on hold  New onset atrial fibrillation with slow ventricular response, elevated troponin -TSH 1.5, echo done-With preserved EF and wall motion -Responded to atropine by EMS when he was hypotensive and bradycardic - Not anticoagulation candidate due to falls, gait instability, may have underlying dementia -seen by cardiology earlier this admission  Chronic diastolic CHF -appears euvolemic now, continue aspirin, not  on diuretics -Status post gentle hydration, now off  Essential hypertension -Initially hypotensive in ED, now improving, continue hydralazine as needed  - hold antihypertensives  Transaminitis -due to rhabdomyolysis, right upper quadrant ultrasound unremarkable, bili is normal -AST ALT improving with improvement in CK  Leukocytosis -No clear signs of infection, UA negative, chest x-ray negative -blood cultures negative, trend WBC  Code Status: DNR DVT Prophylaxis:  Lovenox  Family Communication: called and  discussed with 1 son at bedside and other via telephone  Disposition Plan: SNF tomorrow if remains stable/cognition, rigidity and tremors  Time Spent in minutes  35 minutes  Procedures:  None  Consultants:   Cardiology  Antimicrobials:      Medications  Scheduled Meds: . aspirin  81 mg Oral Daily  . calamine   Topical BID  . enoxaparin (LOVENOX) injection  30 mg Subcutaneous Daily  . finasteride  5 mg Oral Daily  . levothyroxine  75 mcg Oral Daily  . multivitamin with minerals  1 tablet Oral Daily  . rOPINIRole  2-4 mg Oral BID   Continuous Infusions:  PRN Meds:.acetaminophen, atropine, hydrALAZINE, nitroGLYCERIN, ondansetron (ZOFRAN) IV   Antibiotics   Anti-infectives (From admission, onward)   None        Subjective:  -reports a jerky movements in his leg are slightly improved after taking Requip -Aches and pains all over  Objective:   Vitals:   06/25/17 0628 06/25/17 0744 06/25/17 1209 06/25/17 1209  BP: (!) 175/73 (!) 174/113 (!) 141/54 (!) 141/54  Pulse: 69 85 65 66  Resp:   20 20  Temp:   97.9 F (36.6 C) 97.9 F (36.6 C)  TempSrc:   Oral Oral  SpO2:   99% 99%  Weight:      Height:        Intake/Output Summary (Last 24 hours) at 06/25/2017 1332 Last data filed at 06/25/2017 0936 Gross per 24 hour  Intake 3182.5 ml  Output 2650 ml  Net 532.5 ml     Wt Readings from Last 3 Encounters:  06/25/17 79.4 kg (175 lb)     Exam Gen: Awake, Alert, Oriented X oriented to self, place and partly to time HEENT: PERRLA, Neck supple, no JVD Lungs: Good air movement bilaterally, CTAB CVS: S1-S2/regular rate rhythm Abd: soft, Non tender, non distended, BS present Extremities: No Cyanosis, Clubbing or edema Skin: mild erythematous rash noted in upper back Neuro: Moves all extremities, slightly increased tone and clonus     Data Reviewed:  I have personally reviewed following labs and imaging studies  Micro Results Recent Results (from the  past 240 hour(s))  Culture, blood (Routine X 2) w Reflex to ID Panel     Status: None (Preliminary result)   Collection Time: 06/22/17  9:24 PM  Result Value Ref Range Status   Specimen Description BLOOD SITE NOT SPECIFIED  Final   Special Requests   Final    BOTTLES DRAWN AEROBIC AND ANAEROBIC Blood Culture results may not be optimal due to an inadequate volume of blood received in culture bottles   Culture   Final    NO GROWTH 1 DAY Performed at [facility], [address], [place], [place] [address]    Report Status PENDING  Incomplete  Culture, blood (Routine X 2) w Reflex to ID Panel     Status: None (Preliminary result)   Collection Time: 06/22/17  9:24 PM  Result Value Ref Range Status   Specimen Description BLOOD RIGHT HAND  Final   Special Requests   Final    BOTTLES DRAWN AEROBIC AND ANAEROBIC Blood Culture results may not be optimal due to an inadequate volume of blood received in culture bottles   Culture   Final    NO GROWTH 1 DAY Performed at [facility], [address] [place], [place] [address]    Report Status PENDING  Incomplete  Urine Culture     Status: None   Collection Time: 06/22/17 10:02 PM  Result Value Ref Range Status   Specimen Description URINE, RANDOM  Final   Special Requests NONE  Final   Culture   Final    NO GROWTH Performed at [facility], [address], [place], [place] [address]    Report Status 06/24/2017 FINAL  Final    Radiology Reports Ct Head Wo Contrast  Result Date: 06/22/2017 CLINICAL DATA:  Recent fall, altered mental status, hypertension, trauma EXAM: CT HEAD WITHOUT CONTRAST CT CERVICAL SPINE WITHOUT CONTRAST TECHNIQUE: Multidetector CT imaging of the head and cervical spine was performed following the standard protocol without intravenous contrast. Multiplanar CT image reconstructions of the cervical spine were also generated. COMPARISON:  None. FINDINGS: CT HEAD FINDINGS Brain: Brain atrophy noted with  chronic white matter microvascular changes. Remote left parietal infarct with encephalomalacia. No acute intracranial hemorrhage, new mass lesion, new infarction, midline shift, herniation, hydrocephalus. No extra-axial fluid collection. Cisterns are patent. Cerebellar atrophy as well. Vascular: Intracranial atherosclerosis.  No hyperdense vessel. Skull: Normal. Negative for fracture or focal lesion. Sinuses/Orbits: Orbits are symmetric. Minor polypoid mucosal thickening in the right maxillary sinus. No sinus air-fluid level. Other: None. CT CERVICAL SPINE FINDINGS Alignment: Normal. Skull base and vertebrae: No acute fracture. No primary bone lesion or focal pathologic process. Soft tissues and spinal canal: No prevertebral fluid or swelling. No visible canal hematoma. Disc levels: Cervical degenerative spondylosis at all levels with disc space narrowing, sclerosis and osteophytes. C4-5 and C5-6 are most severe. Multilevel facet arthropathy, worse on the right. No subluxation dislocation. Normal prevertebral soft tissues. Degenerative changes of the C1-2 articulation. Upper chest: Negative. Other: None. IMPRESSION: No acute intracranial abnormality by noncontrast CT. Brain atrophy and chronic white matter microvascular changes with remote left parietal infarct noted. Cervical degenerative spondylosis without acute osseous finding, fracture or malalignment. Electronically Signed   By: Judie Petit.  Shick M.D.   On: 06/22/2017 19:48   Ct Cervical Spine Wo Contrast  Result Date: 06/22/2017 CLINICAL DATA:  Recent fall, altered mental status, hypertension, trauma EXAM: CT HEAD WITHOUT CONTRAST CT CERVICAL SPINE WITHOUT CONTRAST TECHNIQUE: Multidetector CT imaging of the head and cervical spine was performed following the standard protocol without intravenous contrast. Multiplanar CT image reconstructions of the cervical spine were also generated. COMPARISON:  None. FINDINGS: CT HEAD FINDINGS Brain: Brain atrophy noted with  chronic white matter microvascular changes. Remote left parietal infarct with encephalomalacia. No acute intracranial hemorrhage, new mass lesion, new infarction, midline shift, herniation, hydrocephalus. No extra-axial fluid collection. Cisterns are patent. Cerebellar atrophy as well. Vascular: Intracranial atherosclerosis.  No hyperdense vessel. Skull: Normal. Negative for fracture or focal lesion. Sinuses/Orbits: Orbits are symmetric. Minor polypoid mucosal thickening in the right maxillary sinus. No sinus air-fluid level. Other: None. CT CERVICAL SPINE FINDINGS Alignment: Normal. Skull base and vertebrae: No acute fracture. No primary bone lesion or focal pathologic process. Soft tissues and spinal canal: No prevertebral fluid or swelling. No visible canal hematoma. Disc levels: Cervical degenerative spondylosis at all levels with disc space narrowing,  sclerosis and osteophytes. C4-5 and C5-6 are most severe. Multilevel facet arthropathy, worse on the right. No subluxation dislocation. Normal prevertebral soft tissues. Degenerative changes of the C1-2 articulation. Upper chest: Negative. Other: None. IMPRESSION: No acute intracranial abnormality by noncontrast CT. Brain atrophy and chronic white matter microvascular changes with remote left parietal infarct noted. Cervical degenerative spondylosis without acute osseous finding, fracture or malalignment. Electronically Signed   By: Judie Petit.  Shick M.D.   On: 06/22/2017 19:48   US Abdomen Complete  Result Date: 06/23/2017 CLINICAL DATA:  Transaminitis. EXAM: ABDOMEN ULTRASOUND COMPLETE COMPARISON:  None. FINDINGS: Gallbladder: Small volume sludge in the gallbladder. No gallstones or wall thickening visualized. No sonographic Murphy sign noted by sonographer. Common bile duct: Diameter: 5 mm Liver: No focal lesion identified, though the left lobe is small and suboptimally evaluated. Within normal limits in parenchymal echogenicity. Portal vein is patent on color  Doppler imaging with normal direction of blood flow towards the liver. IVC: Not well visualized. Pancreas: Obscured by bowel gas. Spleen: Size and appearance within normal limits. Right Kidney: Length: 11.8 cm. Echogenicity within normal limits. No hydronephrosis. Cysts measure 2.5 cm and 2.7 cm. Left Kidney: Length: 10.9 cm. Echogenicity within normal limits. No hydronephrosis. 3.5 cm cyst. Abdominal aorta: Partially obscured by bowel gas. 2.7 cm diameter proximally. Other findings: None. IMPRESSION: 1. Gallbladder sludge without gallstones or biliary dilatation. 2. No significant liver abnormality identified. 3. Bilateral renal cysts. Electronically Signed   By: Sebastian Ache M.D.   On: 06/23/2017 11:16   Dg Chest Port 1 View  Result Date: 06/22/2017 CLINICAL DATA:  Bradycardia EXAM: PORTABLE CHEST 1 VIEW COMPARISON:  None. FINDINGS: Heart size upper normal. Negative for heart failure. Atherosclerotic aortic arch. Lungs are clear without infiltrate effusion or edema. IMPRESSION: No active disease. Electronically Signed   By: Marlan Palau M.D.   On: 06/22/2017 18:15    Lab Data:  CBC: Recent Labs  Lab 06/22/17 1754 06/22/17 1759 06/23/17 1257 06/24/17 0504 06/25/17 0659  WBC 15.5*  --  15.5* 14.7* 12.1*  NEUTROABS 13.7*  --   --   --   --   HGB 12.0* 11.6* 11.1* 12.8* 12.2*  HCT 37.1* 34.0* 33.7* 38.7* 36.2*  MCV 93.2  --  91.3 92.1 90.7  PLT 259  --  253 276 263   Basic Metabolic Panel: Recent Labs  Lab 06/22/17 1754 06/22/17 1759 06/24/17 0504 06/25/17 0659  NA 142 142 142 143  K 3.8 3.8 3.6 3.4*  CL 110 109 114* 111  CO2 21*  --  21* 22  GLUCOSE 124* 126* 92 112*  BUN 57* 53* 47* 29*  CREATININE 1.94* 1.70* 1.56* 1.24  CALCIUM 8.7*  --  8.7* 8.7*  MG 2.3  --   --   --   PHOS 3.9  --   --   --    GFR: Estimated Creatinine Clearance: 37.6 mL/min (by C-G formula based on SCr of 1.24 mg/dL). Liver Function Tests: Recent Labs  Lab 06/22/17 1754 06/23/17 1257  06/24/17 0504 06/25/17 0659  AST 296* 374* 401* 196*  ALT 79* 118* 156* 139*  ALKPHOS 81 78 86 87  BILITOT 1.1 1.1 0.9 1.1  PROT 5.6* 5.3* 6.0* 5.5*  ALBUMIN 3.2* 3.1* 3.3* 3.1*   No results for input(s): LIPASE, AMYLASE in the last 168 hours. Recent Labs  Lab 06/22/17 2124  AMMONIA 18   Coagulation Profile: Recent Labs  Lab 06/22/17 1754  INR 1.17   Cardiac Enzymes: Recent  Labs  Lab 06/22/17 2124 06/23/17 0325 06/23/17 0834 06/23/17 1257 06/25/17 0659  CKTOTAL  --   --   --  16,109* 2,418*  TROPONINI 0.52* 0.41* 0.31*  --   --    BNP (last 3 results) No results for input(s): PROBNP in the last 8760 hours. HbA1C: Recent Labs    06/23/17 0325  HGBA1C 5.7*   CBG: Recent Labs  Lab 06/22/17 1743  GLUCAP 121*   Lipid Profile: Recent Labs    06/23/17 0325  CHOL 143  HDL 61  LDLCALC 70  TRIG 60  CHOLHDL 2.3   Thyroid Function Tests: Recent Labs    06/23/17 0325  TSH 1.509   Anemia Panel: Recent Labs    06/23/17 1257  VITAMINB12 663  FOLATE 36.0   Urine analysis:    Component Value Date/Time   COLORURINE YELLOW 06/22/2017 2202   APPEARANCEUR HAZY (A) 06/22/2017 2202   LABSPEC 1.018 06/22/2017 2202   PHURINE 5.0 06/22/2017 2202   GLUCOSEU NEGATIVE 06/22/2017 2202   HGBUR LARGE (A) 06/22/2017 2202   BILIRUBINUR NEGATIVE 06/22/2017 2202   KETONESUR 5 (A) 06/22/2017 2202   PROTEINUR 100 (A) 06/22/2017 2202   NITRITE NEGATIVE 06/22/2017 2202   LEUKOCYTESUR NEGATIVE 06/22/2017 2202     Zannie Cove M.D. [facility] 06/25/2017, 1:32 PM  Page via  [email or web address] - password [email or web address]  Call night coverage person covering after 7pm

## 2017-06-25 NOTE — Clinical Social Work Note (Addendum)
Insurance authorization still pending.  Charlynn CourtSarah Nazaiah Navarrete, CSW [phone]  1:50 pm Insurance authorization approved: [id number] rug level RVB. MD and SNF notified. Per MD, patient will discharge tomorrow. SNF will hold bed until tomorrow.  Charlynn CourtSarah Laconya Clere, CSW [phone]  2:17 pm CSW provided patient's son, Gala RomneyDoug, with an update.  Charlynn CourtSarah Jabarri Stefanelli, CSW [phone]

## 2017-06-25 NOTE — Care Management Important Message (Signed)
Important Message  Patient Details  Name: Gregory SparClarence Barile MRN: [id number] Date of Birth: Feb 27, 1920   Medicare Important Message Given:  Yes    Oralia RudMegan P Daniell Mancinas 06/25/2017, 4:25 PM

## 2017-06-25 NOTE — Progress Notes (Signed)
Pt's oldest son, Marilu FavreClarence, would like Neurology to be consulted regarding the pt's Parkinson's, Requip medication, confusion, and overall quality of life. Will pass on to day shift RN and continue to monitor.

## 2017-06-26 LAB — CBC
HEMATOCRIT: 33.4 % — AB (ref 39.0–52.0)
HEMOGLOBIN: 11 g/dL — AB (ref 13.0–17.0)
MCH: 29.8 pg (ref 26.0–34.0)
MCHC: 32.9 g/dL (ref 30.0–36.0)
MCV: 90.5 fL (ref 78.0–100.0)
Platelets: 273 10*3/uL (ref 150–400)
RBC: 3.69 MIL/uL — ABNORMAL LOW (ref 4.22–5.81)
RDW: 13.6 % (ref 11.5–15.5)
WBC: 12.7 10*3/uL — ABNORMAL HIGH (ref 4.0–10.5)

## 2017-06-26 LAB — COMPREHENSIVE METABOLIC PANEL
ALBUMIN: 2.9 g/dL — AB (ref 3.5–5.0)
ALK PHOS: 79 U/L (ref 38–126)
ALT: 112 U/L — ABNORMAL HIGH (ref 17–63)
ANION GAP: 7 (ref 5–15)
AST: 110 U/L — ABNORMAL HIGH (ref 15–41)
BILIRUBIN TOTAL: 0.6 mg/dL (ref 0.3–1.2)
BUN: 34 mg/dL — AB (ref 6–20)
CALCIUM: 8.7 mg/dL — AB (ref 8.9–10.3)
CO2: 24 mmol/L (ref 22–32)
CREATININE: 1.3 mg/dL — AB (ref 0.61–1.24)
Chloride: 109 mmol/L (ref 101–111)
GFR calc Af Amer: 52 mL/min — ABNORMAL LOW (ref >60)
GFR calc non Af Amer: 45 mL/min — ABNORMAL LOW (ref >60)
Glucose, Bld: 103 mg/dL — ABNORMAL HIGH (ref 65–99)
Potassium: 3.5 mmol/L (ref 3.5–5.1)
Sodium: 140 mmol/L (ref 135–145)
TOTAL PROTEIN: 5 g/dL — AB (ref 6.5–8.1)

## 2017-06-26 MED ORDER — ACETAMINOPHEN 325 MG PO TABS: 650.0000 mg | ORAL_TABLET | ORAL | Status: AC | PRN

## 2017-06-26 MED ORDER — ROPINIROLE HCL 2 MG PO TABS: 2.0000 mg | ORAL_TABLET | Freq: Two times a day (BID) | ORAL | Status: DC

## 2017-06-26 MED ORDER — AMLODIPINE BESYLATE 10 MG PO TABS: 10.0000 mg | ORAL_TABLET | Freq: Every day | ORAL | Status: AC

## 2017-06-26 NOTE — Progress Notes (Signed)
Pt has cardiac monitoring order that has expired. MD on call paged. Waiting for response.

## 2017-06-26 NOTE — Progress Notes (Signed)
Patient has been restless and with tremors all night, received ambein from night shift MD. No sleep. During report admitted to drinking "wisky" at home LOS 3 days

## 2017-06-26 NOTE — Plan of Care (Signed)
  Problem: Nutrition: Goal: Adequate nutrition will be maintained Note:  Up to chair with gate belt with limited ROM in legs   Problem: Elimination: Goal: Will not experience complications related to bowel motility Note:  Uses thte uril and BSC   Problem: Safety: Goal: Ability to remain free from injury will improve Note:  Chair alarm on

## 2017-06-26 NOTE — Discharge Summary (Signed)
Physician Discharge Summary  Gregory Mcpherson ZOX:[id number] DOB: Jun 24, 1920 DOA: 06/22/2017  PCP: Gordan Payment., MD  Admit date: 06/22/2017 Discharge date: 06/26/2017  Time spent: 35 minutes  Recommendations for Outpatient Follow-up:  1. Dr.Rebecca Tat [facility] in 2-3weeks 2. PCP in 1 week 3. [facility] 6/26 for Event monitor and 7/8 for Cards FU  Discharge Diagnoses:  Principal Problem:   Acute metabolic encephalopathy   Falls   Periodic movement disorder   Chronic diastolic CHF (congestive heart failure) (HCC)   Hypertension   Acute renal failure superimposed on stage 3 chronic kidney disease (HCC)   Fall   New onset atrial fibrillation (HCC)   Elevated troponin   Hypotension   Bradycardia   Leukocytosis   Abnormal LFTs   Discharge Condition: stable  Diet recommendation: low sodium heart healthy  Filed Weights   06/23/17 0010 06/24/17 0500 06/25/17 0423  Weight: 75.1 kg (165 lb 9.1 oz) 74.5 kg (164 lb 3.2 oz) 79.4 kg (175 lb)    History of present illness:  Gregory Mcpherson a 84 y.o.malewith medical history significant ofhypertension,dCHF, movement disorder on Requip,hypothyroidism, depression, CKD-3, depression, who presented with fall and altered mental status    Hospital Course:     Encephalopathy,  -improved -suspect polypharmacy/acute kidney injury contributing, could have underlying senile dementia too, son reports short-term memory deficits which are long-standing -UA negative for UTI, chest x-ray negative for pneumonia, CT head showed no acute intracranial. -Ammonia level 18, TSH 1.5, B-12 and folate are unremarkable -he is alert, awake, oriented to self place and partly to time, mostly back to his baseline now -Requip dose decreased, has some movement disorder, on exam some parkinsonian features, due to increased tremors/jerks will increase p.m. Dose of Requip to baseline 4 mg and keep a.m. Dose at 2 mg -physical therapy consulted, SNF  recommended for rehabilitation -Needs Neurology FU for below listed movement disorder  Movement disorder -patient carries a diagnosis of periodic movement disorder, followed by a neurologist in [place], has been maintained on high-dose Requip, 4mg  BID, we cut am dose down to 2mg  and kept pm dose at 4pm due to somnolence. -On exam he has mild clonus, increased tone, shuffling gait, unclear if all this is part of periodic movement disorder or whether he could have some parkinsonian features -I called and consulted with Dr. Wilford Corner from neurology who felt he would be better served by outpatient follow-up with his primary neurologist or with Dr. Lurena Joiner Tat from [facility] neurology -d/w Family, sent referral to Dr.Tat -slightly decreased dose of Requip as above  Fall/rhabdomyolysis -improved with hydration -Supportive care, was hypotensive on admission, now resolved -clonidine stopped -troponin was mildly elevated on admission, unclear significance in the setting of CK D, seen by cardiology, echo with preserved EF and no wall motion abnormalities  Acute kidney injury on chronic kidney disease stage III/rhabdomyolysis - creatinine 1.9 at the time of admission, baseline 1.3 on 6/3 -Creatinine improving to 1.2, following gentle hydration -CK is significantly improved as well -ARB stopped  New onset atrial fibrillation with slow ventricular response, elevated troponin -resolved, was transient on admission -TSH 1.5, echo done-With preserved EF and wall motion --seen by cardiology earlier this admission, Not felt to be an anticoagulation candidate due to frequent recent falls, gait disorder -Cardiology recommended Event monitor due to fall prior to admission and inability to r/o syncope  Chronic diastolic CHF -appears euvolemic now, continue aspirin, not on diuretics -Status post gentle hydration, now off  Essential hypertension -Initially  hypotensive in ED, now improved -ARB stopped and  amlodipine started  Transaminitis -due to rhabdomyolysis, right upper quadrant ultrasound unremarkable, bili is normal -AST ALT improving with improvement in CK  Leukocytosis -No clear signs of infection, UA negative, chest x-ray negative -blood cultures negative, trend WBC -reactive after fall most likley improved  Code Status: DNR    Procedures:  Consultations:  Cardiology  Discharge Exam: Vitals:   06/25/17 2048 06/26/17 0432  BP: (!) 176/75 (!) 151/84  Pulse: 80 82  Resp: 20 20  Temp: 98 F (36.7 C) 98.5 F (36.9 C)  SpO2: 99% 98%    General: AAOx2 Cardiovascular: S1S2/RRR Respiratory: CTAB  Discharge Instructions   Discharge Instructions    Ambulatory referral to Neurology   Complete by:  As directed    An appointment is requested in approximately: 2-3weeks with Dr.Rebecca Tat for Movement disorder   Diet - low sodium heart healthy   Complete by:  As directed    Increase activity slowly   Complete by:  As directed      Allergies as of 06/26/2017      Reactions   Pravastatin Sodium Other (See Comments)   Muscle aches   Tape Other (See Comments)   Skin is very thin and delicate; PLEASE USE AN ALTERNATIVE TO TAPE!!!   Niacin Rash      Medication List    STOP taking these medications   cloNIDine 0.1 MG tablet Commonly known as:  CATAPRES   telmisartan 40 MG tablet Commonly known as:  MICARDIS     TAKE these medications   acetaminophen 325 MG tablet Commonly known as:  TYLENOL Take 2 tablets (650 mg total) by mouth every 4 (four) hours as needed for fever, headache or mild pain.   amLODipine 10 MG tablet Commonly known as:  NORVASC Take 1 tablet (10 mg total) by mouth daily.   aspirin 81 MG chewable tablet Chew 81 mg by mouth daily.   CENTRUM SILVER 50+MEN Tabs Take 1 tablet by mouth daily.   Cinnamon 500 MG capsule Take 500 mg by mouth daily.   finasteride 5 MG tablet Commonly known as:  PROSCAR Take 5 mg by mouth daily.    levothyroxine 75 MCG tablet Commonly known as:  SYNTHROID, LEVOTHROID Take 75 mcg by mouth daily.   rOPINIRole 2 MG tablet Commonly known as:  REQUIP Take 1-2 tablets (2-4 mg total) by mouth 2 (two) times daily. Take 2mg  at 8am and 4mg  at 8pm What changed:    medication strength  how much to take  additional instructions      Allergies  Allergen Reactions  . Pravastatin Sodium Other (See Comments)    Muscle aches  . Tape Other (See Comments)    Skin is very thin and delicate; PLEASE USE AN ALTERNATIVE TO TAPE!!!  . Niacin Rash    Contact information for follow-up providers    [facility] [address] Follow up on 07/06/2017.   Specialty:  Cardiology Why:  11:30 am for event monitor Contact information: [address], [address] [place] [id number] [phone]       Allayne ButcherSimmons, Brittainy M, PA-C Follow up on 08/19/2017.   Specialties:  Cardiology, Radiology Why:  10:00 am for event monitor follow up Contact information: [address] [place] [id number] [phone]        Tat, Octaviano BattyRebecca S, DO. Schedule an appointment as soon as possible for a visit in 2 week(s).   Specialty:  Neurology Contact information: [address] [place]  [address] [place] [address] [phone]            Contact information for after-discharge care    Destination    [facility] .   Service:  Skilled Nursing Contact information: [address] [place] [address] [phone]                   The results of significant diagnostics from this hospitalization (including imaging, microbiology, ancillary and laboratory) are listed below for reference.    Significant Diagnostic Studies: Ct Head Wo Contrast  Result Date: 06/22/2017 CLINICAL DATA:  Recent fall, altered mental status, hypertension, trauma EXAM: CT HEAD WITHOUT CONTRAST CT CERVICAL SPINE WITHOUT CONTRAST TECHNIQUE: Multidetector CT imaging  of the head and cervical spine was performed following the standard protocol without intravenous contrast. Multiplanar CT image reconstructions of the cervical spine were also generated. COMPARISON:  None. FINDINGS: CT HEAD FINDINGS Brain: Brain atrophy noted with chronic white matter microvascular changes. Remote left parietal infarct with encephalomalacia. No acute intracranial hemorrhage, new mass lesion, new infarction, midline shift, herniation, hydrocephalus. No extra-axial fluid collection. Cisterns are patent. Cerebellar atrophy as well. Vascular: Intracranial atherosclerosis.  No hyperdense vessel. Skull: Normal. Negative for fracture or focal lesion. Sinuses/Orbits: Orbits are symmetric. Minor polypoid mucosal thickening in the right maxillary sinus. No sinus air-fluid level. Other: None. CT CERVICAL SPINE FINDINGS Alignment: Normal. Skull base and vertebrae: No acute fracture. No primary bone lesion or focal pathologic process. Soft tissues and spinal canal: No prevertebral fluid or swelling. No visible canal hematoma. Disc levels: Cervical degenerative spondylosis at all levels with disc space narrowing, sclerosis and osteophytes. C4-5 and C5-6 are most severe. Multilevel facet arthropathy, worse on the right. No subluxation dislocation. Normal prevertebral soft tissues. Degenerative changes of the C1-2 articulation. Upper chest: Negative. Other: None. IMPRESSION: No acute intracranial abnormality by noncontrast CT. Brain atrophy and chronic white matter microvascular changes with remote left parietal infarct noted. Cervical degenerative spondylosis without acute osseous finding, fracture or malalignment. Electronically Signed   By: Judie Petit.  Shick M.D.   On: 06/22/2017 19:48   Ct Cervical Spine Wo Contrast  Result Date: 06/22/2017 CLINICAL DATA:  Recent fall, altered mental status, hypertension, trauma EXAM: CT HEAD WITHOUT CONTRAST CT CERVICAL SPINE WITHOUT CONTRAST TECHNIQUE: Multidetector CT imaging of  the head and cervical spine was performed following the standard protocol without intravenous contrast. Multiplanar CT image reconstructions of the cervical spine were also generated. COMPARISON:  None. FINDINGS: CT HEAD FINDINGS Brain: Brain atrophy noted with chronic white matter microvascular changes. Remote left parietal infarct with encephalomalacia. No acute intracranial hemorrhage, new mass lesion, new infarction, midline shift, herniation, hydrocephalus. No extra-axial fluid collection. Cisterns are patent. Cerebellar atrophy as well. Vascular: Intracranial atherosclerosis.  No hyperdense vessel. Skull: Normal. Negative for fracture or focal lesion. Sinuses/Orbits: Orbits are symmetric. Minor polypoid mucosal thickening in the right maxillary sinus. No sinus air-fluid level. Other: None. CT CERVICAL SPINE FINDINGS Alignment: Normal. Skull base and vertebrae: No acute fracture. No primary bone lesion or focal pathologic process. Soft tissues and spinal canal: No prevertebral fluid or swelling. No visible canal hematoma. Disc levels: Cervical degenerative spondylosis at all levels with disc space narrowing, sclerosis and osteophytes. C4-5 and C5-6 are most severe. Multilevel facet arthropathy, worse on the right. No subluxation dislocation. Normal prevertebral soft tissues. Degenerative changes of the C1-2 articulation. Upper chest: Negative. Other: None. IMPRESSION: No acute intracranial abnormality by noncontrast CT.  Brain atrophy and chronic white matter microvascular changes with remote left parietal infarct noted. Cervical degenerative spondylosis without acute osseous finding, fracture or malalignment. Electronically Signed   By: Judie Petit.  Shick M.D.   On: 06/22/2017 19:48   US Abdomen Complete  Result Date: 06/23/2017 CLINICAL DATA:  Transaminitis. EXAM: ABDOMEN ULTRASOUND COMPLETE COMPARISON:  None. FINDINGS: Gallbladder: Small volume sludge in the gallbladder. No gallstones or wall thickening  visualized. No sonographic Murphy sign noted by sonographer. Common bile duct: Diameter: 5 mm Liver: No focal lesion identified, though the left lobe is small and suboptimally evaluated. Within normal limits in parenchymal echogenicity. Portal vein is patent on color Doppler imaging with normal direction of blood flow towards the liver. IVC: Not well visualized. Pancreas: Obscured by bowel gas. Spleen: Size and appearance within normal limits. Right Kidney: Length: 11.8 cm. Echogenicity within normal limits. No hydronephrosis. Cysts measure 2.5 cm and 2.7 cm. Left Kidney: Length: 10.9 cm. Echogenicity within normal limits. No hydronephrosis. 3.5 cm cyst. Abdominal aorta: Partially obscured by bowel gas. 2.7 cm diameter proximally. Other findings: None. IMPRESSION: 1. Gallbladder sludge without gallstones or biliary dilatation. 2. No significant liver abnormality identified. 3. Bilateral renal cysts. Electronically Signed   By: Sebastian Ache M.D.   On: 06/23/2017 11:16   Dg Chest Port 1 View  Result Date: 06/22/2017 CLINICAL DATA:  Bradycardia EXAM: PORTABLE CHEST 1 VIEW COMPARISON:  None. FINDINGS: Heart size upper normal. Negative for heart failure. Atherosclerotic aortic arch. Lungs are clear without infiltrate effusion or edema. IMPRESSION: No active disease. Electronically Signed   By: Marlan Palau M.D.   On: 06/22/2017 18:15    Microbiology: Recent Results (from the past 240 hour(s))  Culture, blood (Routine X 2) w Reflex to ID Panel     Status: None (Preliminary result)   Collection Time: 06/22/17  9:24 PM  Result Value Ref Range Status   Specimen Description BLOOD SITE NOT SPECIFIED  Final   Special Requests   Final    BOTTLES DRAWN AEROBIC AND ANAEROBIC Blood Culture results may not be optimal due to an inadequate volume of blood received in culture bottles   Culture   Final    NO GROWTH 2 DAYS Performed at [facility], [address], [place], [place] [address]    Report Status  PENDING  Incomplete  Culture, blood (Routine X 2) w Reflex to ID Panel     Status: None (Preliminary result)   Collection Time: 06/22/17  9:24 PM  Result Value Ref Range Status   Specimen Description BLOOD RIGHT HAND  Final   Special Requests   Final    BOTTLES DRAWN AEROBIC AND ANAEROBIC Blood Culture results may not be optimal due to an inadequate volume of blood received in culture bottles   Culture   Final    NO GROWTH 2 DAYS Performed at [facility], [address] [place], [place] [address]    Report Status PENDING  Incomplete  Urine Culture     Status: None   Collection Time: 06/22/17 10:02 PM  Result Value Ref Range Status   Specimen Description URINE, RANDOM  Final   Special Requests NONE  Final   Culture   Final    NO GROWTH Performed at [facility], [address], [place], [place] [address]    Report Status 06/24/2017 FINAL  Final     Labs: Basic Metabolic Panel: Recent Labs  Lab 06/22/17 1754 06/22/17 1759 06/24/17 2130 06/25/17 8657  06/26/17 0541  NA 142 142 142 143 140  K 3.8 3.8 3.6 3.4* 3.5  CL 110 109 114* 111 109  CO2 21*  --  21* 22 24  GLUCOSE 124* 126* 92 112* 103*  BUN 57* 53* 47* 29* 34*  CREATININE 1.94* 1.70* 1.56* 1.24 1.30*  CALCIUM 8.7*  --  8.7* 8.7* 8.7*  MG 2.3  --   --   --   --   PHOS 3.9  --   --   --   --    Liver Function Tests: Recent Labs  Lab 06/22/17 1754 06/23/17 1257 06/24/17 0504 06/25/17 0659 06/26/17 0541  AST 296* 374* 401* 196* 110*  ALT 79* 118* 156* 139* 112*  ALKPHOS 81 78 86 87 79  BILITOT 1.1 1.1 0.9 1.1 0.6  PROT 5.6* 5.3* 6.0* 5.5* 5.0*  ALBUMIN 3.2* 3.1* 3.3* 3.1* 2.9*   No results for input(s): LIPASE, AMYLASE in the last 168 hours. Recent Labs  Lab 06/22/17 2124  AMMONIA 18   CBC: Recent Labs  Lab 06/22/17 1754 06/22/17 1759 06/23/17 1257 06/24/17 0504 06/25/17 0659 06/26/17 0541  WBC 15.5*  --  15.5* 14.7* 12.1* 12.7*  NEUTROABS 13.7*  --   --   --   --   --   HGB  12.0* 11.6* 11.1* 12.8* 12.2* 11.0*  HCT 37.1* 34.0* 33.7* 38.7* 36.2* 33.4*  MCV 93.2  --  91.3 92.1 90.7 90.5  PLT 259  --  253 276 263 273   Cardiac Enzymes: Recent Labs  Lab 06/22/17 2124 06/23/17 0325 06/23/17 0834 06/23/17 1257 06/25/17 0659  CKTOTAL  --   --   --  16,109* 2,418*  TROPONINI 0.52* 0.41* 0.31*  --   --    BNP: BNP (last 3 results) Recent Labs    06/22/17 2124  BNP 817.9*    ProBNP (last 3 results) No results for input(s): PROBNP in the last 8760 hours.  CBG: Recent Labs  Lab 06/22/17 1743  GLUCAP 121*       Signed:  Zannie Cove MD.  [facility] 06/26/2017, 10:50 AM

## 2017-06-26 NOTE — Clinical Social Work Placement (Signed)
   CLINICAL SOCIAL WORK PLACEMENT  NOTE  Date:  06/26/2017  Patient Details  Name: Gregory SparClarence Croft MRN: [id number] Date of Birth: [birth year]  Clinical Social Work is seeking post-discharge placement for this patient at the Skilled  Nursing Facility level of care (*CSW will initial, date and re-position this form in  chart as items are completed):  Yes   Patient/family provided with [facility] Clinical Social Work Department's list of facilities offering this level of care within the geographic area requested by the patient (or if unable, by the patient's family).  Yes   Patient/family informed of their freedom to choose among providers that offer the needed level of care, that participate in Medicare, Medicaid or managed care program needed by the patient, have an available bed and are willing to accept the patient.  Yes   Patient/family informed of [facility]'s ownership interest in [facility] and [facility], as well as of the fact that they are under no obligation to receive care at these facilities.  PASRR submitted to EDS on 06/23/17     PASRR number received on       Existing PASRR number confirmed on 06/24/17     FL2 transmitted to all facilities in geographic area requested by pt/family on 06/23/17     FL2 transmitted to all facilities within larger geographic area on       Patient informed that his/her managed care company has contracts with or will negotiate with certain facilities, including the following:        Yes   Patient/family informed of bed offers received.  [facility]     Physician recommends and patient chooses bed at      Patient to be transferred to [facility] [place] on 06/26/17.  Patient to be transferred to facility by [facility]     Patient family notified on 06/26/17 of transfer.  Name of family member notified:  Larene Beachoug [place]     PHYSICIAN Please prepare prescriptions     Additional Comment:     _______________________________________________ Margarito LinerSarah C Anandi Abramo, LCSW 06/26/2017, 12:20 PM

## 2017-06-26 NOTE — Clinical Social Work Note (Signed)
CSW left voicemail for SNF admissions coordinator to notify her that discharge summary is in and to find out what time transport can be arranged.  Charlynn CourtSarah Racquelle Hyser, CSW [phone]

## 2017-06-26 NOTE — Clinical Social Work Note (Signed)
CSW facilitated patient discharge including contacting patient family and facility to confirm patient discharge plans. Clinical information faxed to facility and family agreeable with plan. CSW arranged ambulance transport via [facility] to [facility]. RN to call report prior to discharge [phone] Room [phone]).  CSW will sign off for now as social work intervention is no longer needed. Please consult us again if new needs arise.  Charlynn CourtSarah Nathaniel Wakeley, CSW [phone]

## 2017-06-28 LAB — CULTURE, BLOOD (ROUTINE X 2)
CULTURE: NO GROWTH
CULTURE: NO GROWTH

## 2017-07-06 ENCOUNTER — Ambulatory Visit (INDEPENDENT_AMBULATORY_CARE_PROVIDER_SITE_OTHER): Payer: Medicare Other

## 2017-07-06 DIAGNOSIS — R001 Bradycardia, unspecified: Secondary | ICD-10-CM | POA: Diagnosis not present

## 2017-07-06 NOTE — Progress Notes (Signed)
Gregory Mcpherson was seen today in the movement disorders clinic for neurologic consultation at the request of Dr. Jomarie Longs.  His PCP is Gordan Payment., MD.  The consultation is for the evaluation of "movement disorder."  The records that were made available to me were reviewed.  His son and daughter-in-law accompany him and supplement the history.  He was previously followed in [place].  He was last seen by the PA there in 03/2016.   He was previously seen by Dr. Adella Hare as well.  The most remote notes I have are from 2015 and reports that the patient's diagnosis was periodic limb movement disorder and was on ropinirole, 4 mg, 1 tablet in the morning and 2 at night.  This was later increased to 8 mg twice per day.  When he was seen by neurology in July, 2017 the patient was complaining about awakening throughout the night but did not specify why he was awakening because "patient is poor historian due to memory impairment."  His ropinirole was changed to Requip XL, 8 mg, 2 tablets at night.  Gabapentin was subsequently added.  He was not seen back after that by neurology.  He was recently hospitalized due to mental status change.  This was suspected due to polypharmacy/acute kidney injury superimposed upon dementia.  His ropinirole dose was decreased somewhat.  He was on 4 mg twice per day when he entered the hospital and when he left the hospital he was on 2 mg in the morning and 4 mg at night.  It was discovered in the hospital he was somewhat parkinsonian and it was advised to follow-up here.  He also had new onset atrial fibrillation.  He also had transaminitis due to rhabdomyolysis after fall.  He had a right upper quadrant ultrasound which was unremarkable.  His AST and ALT were improving.   Specific Symptoms:  Tremor: No. - but he has a "jumping" of the arms and legs sometimes.  "jumping" is described as a myoclonic jerking.  Ropinirole seemed to help it.   Family hx of similar:  No. Voice: no  change Sleep: has some nocturia x 3  Vivid Dreams:  No.  Acting out dreams:  No. Wet Pillows: No. Postural symptoms:  Yes.    Falls?  Yes.   (pt denies as doesn't remember) - fell out of shower 1 weeks ago and then fell in den Bradykinesia symptoms: shuffling gait, slow movements, drooling while awake and difficulty getting out of a chair Loss of smell:  No. Loss of taste:  No. Urinary Incontinence:  No. Difficulty Swallowing:  No. Trouble with ADL's:  No.  Trouble buttoning clothing: No. Depression:  No. Memory changes:  Yes.   (lives by self and plan is to go home independently) Hallucinations:  No.  visual distortions: No. N/V:  No. Lightheaded:  No.  Syncope: No. Diplopia:  No. Dyskinesia:  No.  Neuroimaging of the brain has previously been performed.  It is available for my review today.  CT of the brain demonstrated atrophy.  There was white matter disease.  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  PREVIOUS MEDICATIONS: Requip  ALLERGIES:   Allergies  Allergen Reactions  . Pravastatin Sodium Other (See Comments)    Muscle aches  . Tape Other (See Comments)    Skin is very thin and delicate; PLEASE USE AN ALTERNATIVE TO TAPE!!!  . Niacin Rash    CURRENT MEDICATIONS:  Outpatient Encounter Medications as of 07/08/2017  Medication Sig  . acetaminophen (TYLENOL) 325 MG tablet Take 2 tablets (650 mg total) by mouth every 4 (four) hours as needed for fever, headache or mild pain.  Marland Kitchen amLODipine (NORVASC) 10 MG tablet Take 1 tablet (10 mg total) by mouth daily.  Marland Kitchen aspirin 81 MG chewable tablet Chew 81 mg by mouth daily.   . Cinnamon 500 MG capsule Take 500 mg by mouth daily.  . finasteride (PROSCAR) 5 MG tablet Take 5 mg by mouth daily.  Marland Kitchen levothyroxine (SYNTHROID, LEVOTHROID) 75 MCG tablet Take 75 mcg by mouth daily.  . Multiple Vitamins-Minerals (CENTRUM SILVER 50+MEN) TABS Take 1 tablet by mouth daily.  Marland Kitchen rOPINIRole (REQUIP) 4 MG tablet Take 4 mg by mouth daily.   . [DISCONTINUED] rOPINIRole (REQUIP) 2 MG tablet Take 1-2 tablets (2-4 mg total) by mouth 2 (two) times daily. Take 2mg  at 8am and 4mg  at 8pm   No facility-administered encounter medications on file as of 07/08/2017.     PAST MEDICAL HISTORY:   Past Medical History:  Diagnosis Date  . CHF (congestive heart failure) (HCC)   . CKD (chronic kidney disease), stage III (HCC)   . Depression   . Hypertension   . Hypothyroidism   . Thyroid disease     PAST SURGICAL HISTORY:  History reviewed. No pertinent surgical history.  SOCIAL HISTORY:   Social History   Socioeconomic History  . Marital status: Not on file    Spouse name: Not on file  . Number of children: 4  . Years of education: Not on file  . Highest education level: Some college, no degree  Occupational History  . Occupation: Retired  Engineer, production  . Financial resource strain: Not on file  . Food insecurity:    Worry: Not on file    Inability: Not on file  . Transportation needs:    Medical: Not on file    Non-medical: Not on file  Tobacco Use  . Smoking status: Never Smoker  . Smokeless tobacco: Never Used  Substance and Sexual Activity  . Alcohol use: Not Currently  . Drug use: Not Currently  . Sexual activity: Not on file  Lifestyle  . Physical activity:    Days per week: Not on file    Minutes per session: Not on file  . Stress: Not on file  Relationships  . Social connections:    Talks on phone: Not on file    Gets together: Not on file    Attends religious service: Not on file    Active member of club or organization: Not on file    Attends meetings of clubs or organizations: Not on file    Relationship status: Not on file  . Intimate partner violence:    Fear of current or ex partner: Not on file    Emotionally abused: Not on file    Physically abused: Not on file    Forced sexual activity: Not on file  Other Topics Concern  . Not on file  Social History Narrative   Patient is currently in a  rehabilitation unit at [facility] due to a fall. He drinks 1 glass of tea a day, and occasionally a cup of coffee. He has physical therapy every day.     FAMILY HISTORY:   Family Status  Relation Name Status  . Mother  (Not Specified)  . Father  (Not Specified)  . Son  Deceased    ROS:  ROS  PHYSICAL EXAMINATION:  VITALS:   Vitals:   07/08/17 0948  BP: (!) 128/50  Pulse: 60  SpO2: 97%  Weight: 164 lb (74.4 kg)  Height: 5\' 10"  (1.778 m)    GEN:  The patient appears stated age and is in NAD. HEENT:  Normocephalic, atraumatic.  The mucous membranes are moist. The superficial temporal arteries are without ropiness or tenderness. CV:  RRR Lungs:  CTAB Neck/HEME:  There are no carotid bruits bilaterally.  Neurological examination:  Orientation: The patient is alert and oriented x2.  He looks to his family for the finer aspects of the history (partly due to memory and partly due to significant hearing loss). Cranial nerves: There is good facial symmetry.  There is mild facial hypomimia with lips parted at times.  Pupils are equal round and reactive to light bilaterally. Fundoscopic exam is attempted but the disc margins are not well visualized bilaterally.. Extraocular muscles are intact. The visual fields are full to confrontational testing. The speech is fluent and clear. Soft palate rises symmetrically and there is no tongue deviation. Hearing is markedly decreased to conversational tone. Sensation: Sensation is intact to light and pinprick throughout (facial, trunk, extremities). Vibration is intact at the bilateral big toe. There is no extinction with double simultaneous stimulation. There is no sensory dermatomal level identified. Motor: Strength is 5/5 in the bilateral upper and lower extremities.   Shoulder shrug is equal and symmetric.  There is no pronator drift. Deep tendon reflexes: Deep tendon reflexes are 2/4 at the bilateral biceps, triceps, brachioradialis, 2+  at the bilateral patella and 1/4 at the bilateral achilles. Plantar responses are downgoing bilaterally.  Movement examination: Tone: There is normal tone in the bilateral upper extremities.  The tone in the lower extremities is normal.  Abnormal movements: None Coordination:  There is no decremation with RAM's, with any form of RAMS, including alternating supination and pronation of the forearm, hand opening and closing, finger taps, heel taps and toe taps. Gait and Station: The patient has difficulty arising out of a deep-seated chair without the use of the hands and is unable to do this.  He ultimately pushes off of the chair.  He is short stepped with some shuffling.  He turns en bloc.      Chemistry      Component Value Date/Time   NA 140 06/26/2017 0541   K 3.5 06/26/2017 0541   CL 109 06/26/2017 0541   CO2 24 06/26/2017 0541   BUN 34 (H) 06/26/2017 0541   CREATININE 1.30 (H) 06/26/2017 0541      Component Value Date/Time   CALCIUM 8.7 (L) 06/26/2017 0541   ALKPHOS 79 06/26/2017 0541   AST 110 (H) 06/26/2017 0541   ALT 112 (H) 06/26/2017 0541   BILITOT 0.6 06/26/2017 0541     Lab Results  Component Value Date   TSH 1.509 06/23/2017     ASSESSMENT/PLAN:  1.  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  I told the patient that I do not see evidence of idiopathic parkinsons disease but I do think that it is possible that the patient has vascular parkinsonism.  This is evidenced by the fact that the patient looks pretty good clinically when sitting down but looks more parkinsonian when ambulating, with short shuffling steps.  I talked to the patient about the difference between idiopathic Parkinson's disease and vascular parkinsonism.  I told the patient that carbidopa/levodopa only works about 30% of the time  in patients with vascular parkinsonism, and in those patients that it does work, it usually only works for a few years.  The  patient would like to try the medication and his family would like to try it as well.  We will cautiously start the medication.  They will let me know if they have any problems with it.  It is possible, that he has Parkinson's disease and ropinirole is covering up the symptoms, but I think it is less likely.  -Agree with lowering ropinirole given memory change and kidney function.  I am not sure that his prior diagnosis of periodic limb movement disorder is really accurate.  His son describes this as myoclonus.  Explained that periodic limb movement disorder is a sleep disorder and family describes his movements as a daytime disorder in which he would have jerking episodes.  My suspicion is that he had myoclonus related to renal insufficiency.  Will slowly wean his ropinirole from 2 mg in the morning and 4 mg in the night to just 2 mg at night.  2.  Probable dementia  -Could not really do a Moca in the office today because of profound hearing loss.  He did not wear his hearing aids.  Regardless, I expressed concern to the patients family about discharge planning (which was to release him from [facility] this Friday back to independent living).  He has no family close.  All family are in the [place] area.  We talked about neurocognitive testing.  My suspicion is, at the very least, he will need assisted independent living, but suspect that he will need more care than even this.  They were given information to [facility] where they can get neurocognitive testing done faster than in the [place] area.  3.  I would like to see him back in the next 4 months, sooner should new neurologic issues arise.  Much greater than 50% of this visit was spent in counseling and coordinating care.  Total face to face time:  60 min  Cc:  Gordan PaymentGrisso, [name], MD

## 2017-07-07 ENCOUNTER — Ambulatory Visit: Payer: Medicare Other | Admitting: Neurology

## 2017-07-07 ENCOUNTER — Telehealth: Payer: Self-pay | Admitting: Cardiology

## 2017-07-07 NOTE — Telephone Encounter (Signed)
LVM on son's number, attempted to call pt again with no answer.

## 2017-07-07 NOTE — Telephone Encounter (Signed)
Pt is at [facility] at this time. Per his LPN, pt was awake when his event happened and did not pass out. Per Dr Elberta Fortisamnitz, pt should have an EP consult for further review. Will forward to scheduling to contact pt's son Gala RomneyDoug @ [phone].

## 2017-07-07 NOTE — Telephone Encounter (Signed)
Made several attempts to contact pt and his son with no answer. VM left on son's contact number. Called EMS for a courtesy visit. Pt has hx of fall/syncope. [employer] monitor showed 4.5sec pause.

## 2017-07-07 NOTE — Telephone Encounter (Signed)
New message    [employer] is calling with a critical EKG.

## 2017-07-07 NOTE — Telephone Encounter (Signed)
LVM for return call. 

## 2017-07-08 ENCOUNTER — Encounter: Payer: Self-pay | Admitting: Neurology

## 2017-07-08 ENCOUNTER — Ambulatory Visit: Payer: Medicare Other | Admitting: Neurology

## 2017-07-08 VITALS — BP 128/50 | HR 60 | Ht 70.0 in | Wt 164.0 lb

## 2017-07-08 DIAGNOSIS — N289 Disorder of kidney and ureter, unspecified: Secondary | ICD-10-CM | POA: Diagnosis not present

## 2017-07-08 DIAGNOSIS — G214 Vascular parkinsonism: Secondary | ICD-10-CM | POA: Diagnosis not present

## 2017-07-08 DIAGNOSIS — G20 Parkinson's disease: Secondary | ICD-10-CM

## 2017-07-08 DIAGNOSIS — G253 Myoclonus: Secondary | ICD-10-CM

## 2017-07-08 DIAGNOSIS — F0280 Dementia in other diseases classified elsewhere without behavioral disturbance: Secondary | ICD-10-CM | POA: Diagnosis not present

## 2017-07-08 MED ORDER — CARBIDOPA-LEVODOPA 25-100 MG PO TABS: 1.0000 | ORAL_TABLET | Freq: Three times a day (TID) | ORAL | 3 refills | Status: AC

## 2017-07-08 NOTE — Patient Instructions (Signed)
1.  Decrease requip to 2 mg twice per day for a week and then 2 mg once per day at bedtime 2.  Start Carbidopa Levodopa as follows:  Take 1/2 tablet three times daily, at least 30 minutes before meals, for one week  Then take 1/2 tablet in the morning, 1/2 tablet in the afternoon, 1 tablet in the evening, at least 30 minutes before meals, for one week  Then take 1/2 tablet in the morning, 1 tablet in the afternoon, 1 tablet in the evening, at least 30 minutes before meals, for one week  Then take 1 tablet three times daily, at 7am, 11am, 4pm   As a reminder, carbidopa/levodopa can be taken at the same time as a carbohydrate, but we like to have you take your pill either 30 minutes before a protein source or 1 hour after as protein can interfere with carbidopa/levodopa absorption.  2.  I do not recommend independent living.  I do recommend neurocognitive testing to determine appropriate level of care.  My suspicion is that this is going to be independent assisted living, at a very minimum and likely more.  3.  I do recommend neurocognitive testing with Dr. Ron ParkerKaren Sullivan at [facility].  Her information is: Address: [address], [place], [place] [address] Phone: [phone]

## 2017-07-09 ENCOUNTER — Telehealth: Payer: Self-pay | Admitting: *Deleted

## 2017-07-09 NOTE — Telephone Encounter (Signed)
[employer] CALL REPORT.

## 2017-07-09 NOTE — Telephone Encounter (Signed)
Spoke with [employer] and pt had a 4 sec pause.Marland Kitchen. Pt is seeing Dr. Elberta Fortisamnitz for consult on 07/14/17. Will forward to Sherri/ Dr. Elberta Fortisamnitz for any further recommendations. Spoke with [facility]/ Heather to check on pt and she said he reports he is asymptomatic and sitting in the chair. Advised her to please notify us if anything changes and to continue to monitor.

## 2017-07-09 NOTE — Telephone Encounter (Signed)
Late Entry: 11:50am - received another critical monitor alert from today at 11:04 am showing 4.1 sec pause, HR 30, auto-triggered. 12:46 pm - Followed up w/ nurse at [facility], Heather, who reports pt is doing fine and is sitting up eating. Informed her that pt is scheduled for a consult on Tuesday to discuss PPM implant.

## 2017-07-09 NOTE — Telephone Encounter (Signed)
Received critical report from [employer] showing 5 sec pause at 4:51 am this morning, auto-triggered. Spoke with nursing staff, Herbert SetaHeather, at [facility] in [place].  She tells me that she is not sure if pt was awake at the time or not - she is day shift.  No documentation to tell us more about pt status at that time. Reviewed w/ DOD, no orders received. Order already given 2 days ago for EP consult.  Scheduler has attempted to reach family (left message) to arrange OV. Asked her to call again today.

## 2017-07-10 ENCOUNTER — Telehealth: Payer: Self-pay | Admitting: Cardiology

## 2017-07-10 ENCOUNTER — Telehealth: Payer: Self-pay

## 2017-07-10 ENCOUNTER — Telehealth: Payer: Self-pay | Admitting: Neurology

## 2017-07-10 NOTE — Telephone Encounter (Signed)
Called Dr Bayard BeaverSullivans office for fax # They request last 3 OV notes, and imaging reports faxed to [phone]  Mariel Kanskyalled Doug, Pt's son and advised him I am faxing requested information, and he may call this afternoon to schedule.

## 2017-07-10 NOTE — Telephone Encounter (Signed)
New Message ° ° °Pt returning call for nurse °

## 2017-07-10 NOTE — Telephone Encounter (Signed)
Received critical monitor alert from [employer] yesterday (07/09/17) at 1:35 pm (CT) showing 4 sec pause, Sinus Rhythm w/ Artifact, auto-triggered. Patient is in [facility]. Unable to get in contact with the nursing staff at facility. Patient is scheduled for a consult on Tuesday to discuss PPM implant.   Will have DOD, Dr. Tenny Crawoss review transmission.   Put transmission in Dr. Elberta Fortisamnitz box to review before appointment. Per previous note, nurse spoke with the nurse at [facility] and patient was doing fine yesterday after this event was recorded.

## 2017-07-10 NOTE — Telephone Encounter (Signed)
Patient son states that we referred his father to [facility] to Dr Brayton Cavesosullivan office and they need a referral to be able to see patient. He called the office and they state they have not gotten a referral from us at this time. He would like a call back to let him know when we send the referral so he can call back to the office to get his appt sch

## 2017-07-10 NOTE — Telephone Encounter (Signed)
I returned call to patient's son Nedra HaiLee (dpr on file) he states he received a call from Casimiro NeedleMichael yesterday regarding critical monitor results. He states he scheduled an appointment for 07/14/17 for consult for PPM. He is requesting to speak with Sherrie because his father will be 89 this year and he is not interested in moving forward with pacemaker and he has a DNR. I informed him I will forward to [facility] and he should follow up on Monday once Sherrie in back in the office. He stated understanding and thankful for the call

## 2017-07-13 ENCOUNTER — Telehealth: Payer: Self-pay | Admitting: *Deleted

## 2017-07-13 NOTE — Telephone Encounter (Signed)
Spoke to Rock[place]Lee, son. Informed that consult would have been to discuss low HRs and possible PPM implant.  Informed that if pt & family do not wish to proceed w/ PPM or any other intervention then there would no need for appt tomorrow. (Discussed this w/ Dr. Elberta Fortisamnitz prior to returning son's call) Son informs me that they do wish to proceed with any intervention as pt is [age] and DNR.  Informed son that I would cancel appt for tomorrow and asked them to call office back if they change their mind. Son appreciates our understanding and agreeable to rescheduling appt if they would to discuss PPM.

## 2017-07-13 NOTE — Telephone Encounter (Signed)
Showed the DOD Dr Abe PeopleVaransi the pts event monitor.  Continue monitoring and forward this message to Primary Cardiologist and EP MD as an FYI.

## 2017-07-13 NOTE — Telephone Encounter (Signed)
[employer] calling in on this pts event monitor recording from today 7/1 at 3:14 pm CT showing that the pt went from sinus rhythm at a rate of 60-70 bpm to a 4.3 sec pause, auto-triggered.  Pt is at [facility] at this current time.  Pt was initially scheduled to see Dr Elberta Fortisamnitz tomorrow 7/2 for consultation of pauses/bradycardia.  Pts Son refused this appt for he states that the pt lives in a nursing home, is 84 years old, and has an active DNR.  As indicated below is notation between Dr Elberta Fortisamnitz RN and the pts Son.  Also Dr Elberta Fortisamnitz RN will be speaking with his RN tomorrow to discuss setting possible parameters on the pts event monitor, or discontinuing the pts monitor, being the family wanting no intervention done at this time.  Will show event monitor recording to DOD Dr. Eldridge DaceVaranasi, to have this scanned in.    Price, Annita BrodSherri L, RN       1:12 PM  Note    Spoke to Hen[place]Lee, son. Informed that consult would have been to discuss low HRs and possible PPM implant.  Informed that if pt & family do not wish to proceed w/ PPM or any other intervention then there would no need for appt tomorrow. (Discussed this w/ Dr. Elberta Fortisamnitz prior to returning son's call) Son informs me that they do wish to proceed with any intervention as pt is 89 and DNR.  Informed son that I would cancel appt for tomorrow and asked them to call office back if they change their mind. Son appreciates our understanding and agreeable to rescheduling appt if they would to discuss PPM.

## 2017-07-13 NOTE — Telephone Encounter (Signed)
Follow up   Please call son to discuss moving forward with pacemaker

## 2017-07-14 ENCOUNTER — Institutional Professional Consult (permissible substitution): Payer: Medicare Other | Admitting: Cardiology

## 2017-07-14 ENCOUNTER — Telehealth: Payer: Self-pay | Admitting: *Deleted

## 2017-07-14 NOTE — Telephone Encounter (Signed)
Followed back up with son about monitor. Son informs me that patient is [place] [facility] [place]. Son asked some general PPM questions to better understand.  At this time family/pt is not interested in any intervention such as PPM.  They will find him a cardiologist in [place] to meet with and discuss further. Called [facility] and informed RN taking care of patient to stop event monitor and mail back to the company.  Heather, RN is agreeable and will ensure this is taken care of before pt moves at the end of the week. Called [employer] to inform them of cancellation of monitor.

## 2017-07-15 ENCOUNTER — Telehealth: Payer: Self-pay | Admitting: *Deleted

## 2017-07-15 NOTE — Telephone Encounter (Signed)
Dunn, Tacey Ruizayna N, PA-C  Jerman Tinnon, Annita BrodSherri L, RN        Hey Zai Chmiel, I am covering [employer]'s box while she is out. I think since they wish to pursue conservative rx only, that they can follow up with us only PRN. If they change their mind they can always reschedule. OK to d/c monitor if they do not feel they would act on the results at all. I would encourage f/u with primary care. After you speak with family can you put this in a phone note so others can view if they do have future needs? Thanks. Ronie Spiesayna Dunn PA-C   Previous Messages    ----- Message -----  From: Baird LyonsPrice, Shyvonne Chastang L, RN  Sent: 07/13/2017  1:16 PM  To: Baird LyonsSherri L Glover Capano, RN, Brittainy Sherlynn CarbonM Simmons, PA-C, *  Subject: Follow up in August still needed?         Pt scheduled to follow up w/ you on 8/7 post event monitor.  Pt monitor has been showing brady & pauses 4-5 sec long. However, consult w/ Camnitz for 7/2 cancelled -- pt does not want PPM. Spoke to son, Nedra HaiLee, who informs us dad is [id number], a DNR and they do not wish for any intervention such as PPM.   Son is asking if pt needs to continue monitoring and if they need appt for 8/7.  Please advise,  Thanks,  Dory HornSherri Tiwatope Emmitt, RN

## 2017-07-15 NOTE — Telephone Encounter (Signed)
Informed son 8/7 OV cancelled.

## 2017-08-19 ENCOUNTER — Ambulatory Visit: Payer: Medicare Other | Admitting: Cardiology

## 2017-12-03 ENCOUNTER — Ambulatory Visit: Payer: Medicare Other | Admitting: Neurology

## 2019-07-15 IMAGING — CT CT CERVICAL SPINE W/O CM
4 of 6 series · 14 of 33 positions shown, 16 images · non-contrast
Comparison: None.

CLINICAL DATA: Recent fall, altered mental status, hypertension,
trauma

EXAM:
CT HEAD WITHOUT CONTRAST
CT CERVICAL SPINE WITHOUT CONTRAST
TECHNIQUE: Multidetector CT imaging of the head and cervical spine was
performed following the standard protocol without intravenous
contrast. Multiplanar CT image reconstructions of the cervical spine
were also generated.

[Series 5: head bone · axial · 0.46mm/px · z∈[-157,-115]mm · 2 of 86 slices shown]
[im 22/86  bone]
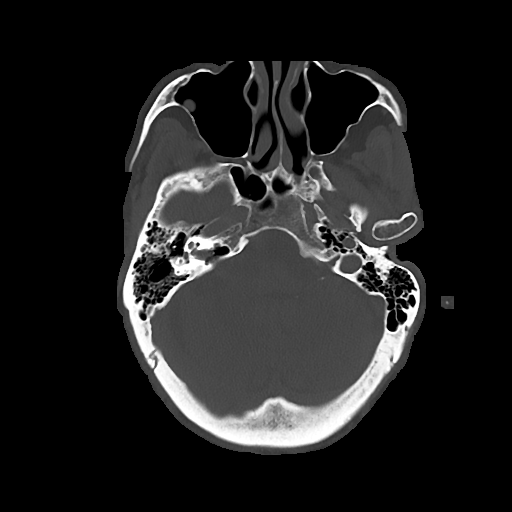
[im 43/86  bone]
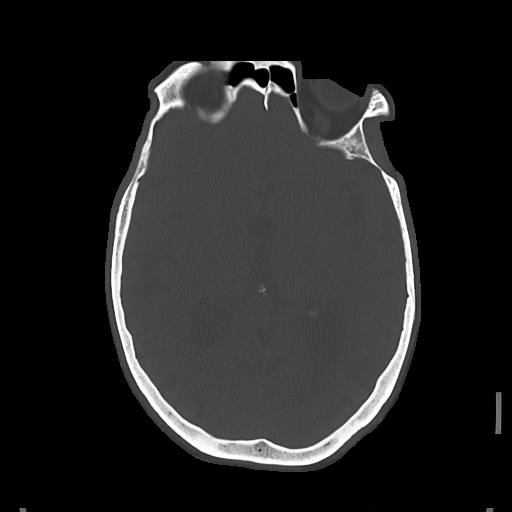

[Series 6: cor soft · coronal · 0.36mm/px · 3 of 76 slices shown]
[im 16/76  bone]
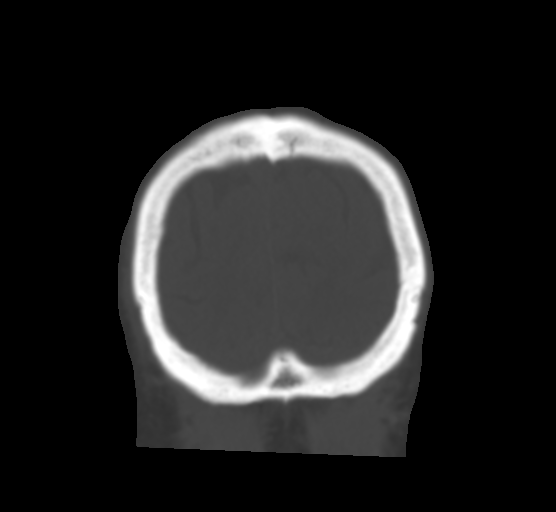
[im 31/76  bone]
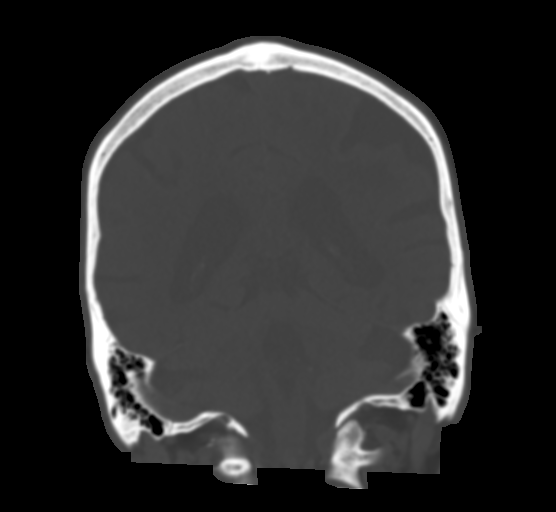
[im 46/76  bone]
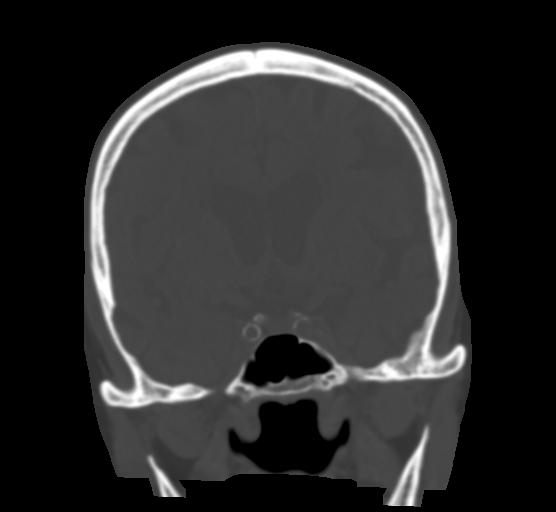

[Series 9: c spine soft · axial · 0.50mm/px · z∈[-317,-171]mm · 5 of 109 slices shown, 7 images]
[im 19/109  soft-tissue]
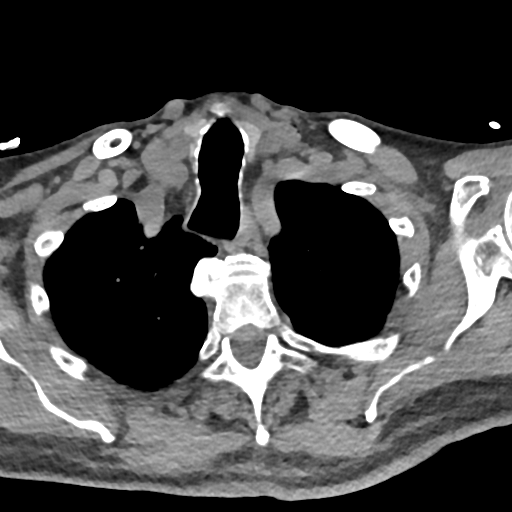
[im 19/109  bone]
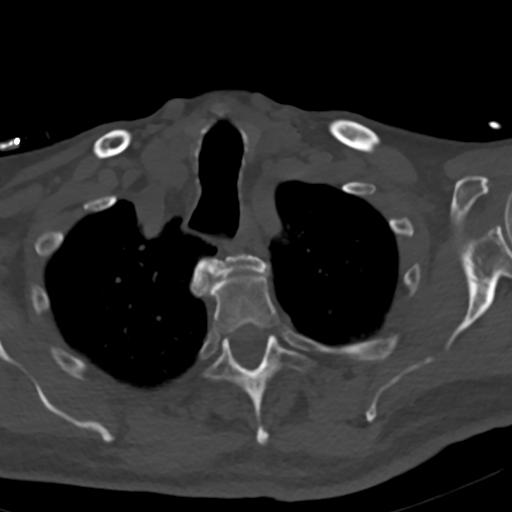
[im 37/109  bone]
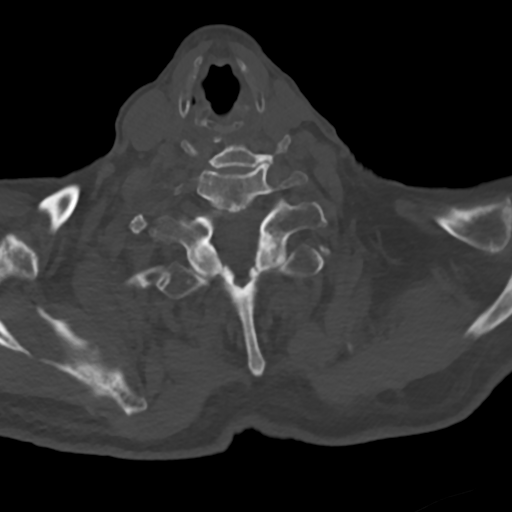
[im 55/109  bone]
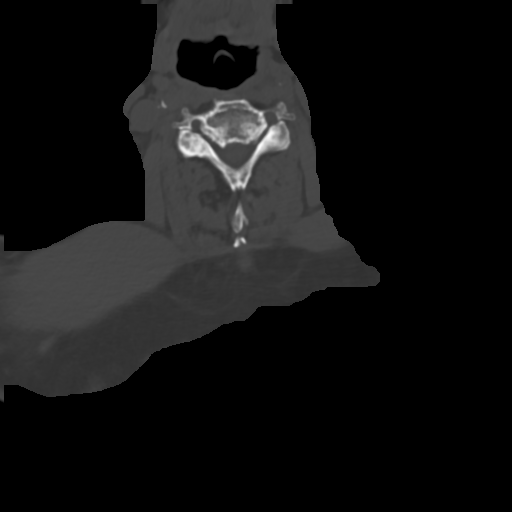
[im 73/109  bone]
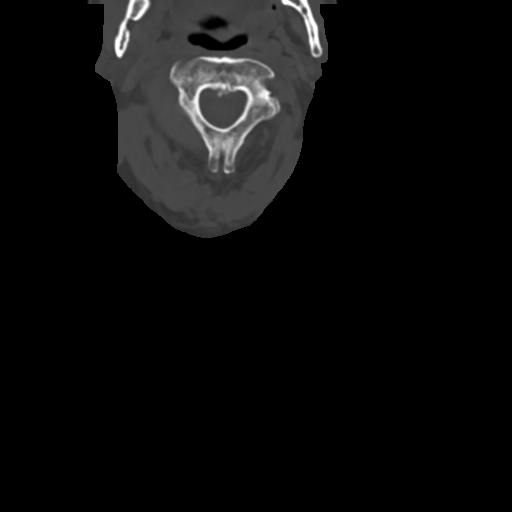
[im 91/109  soft-tissue]
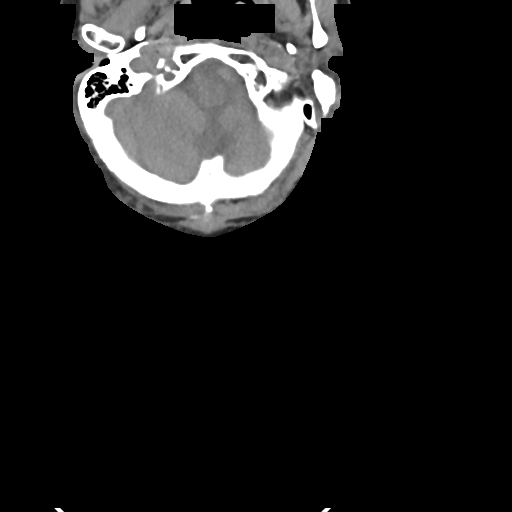
[im 91/109  bone]
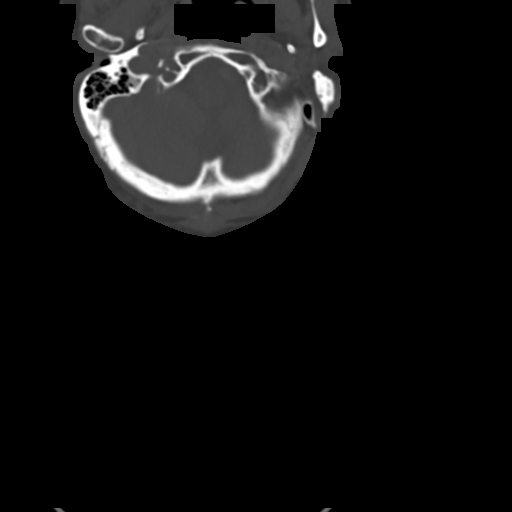

[Series 10: sag bone · sagittal · 0.27mm/px · 4 of 61 slices shown]
[im 13/61  bone]
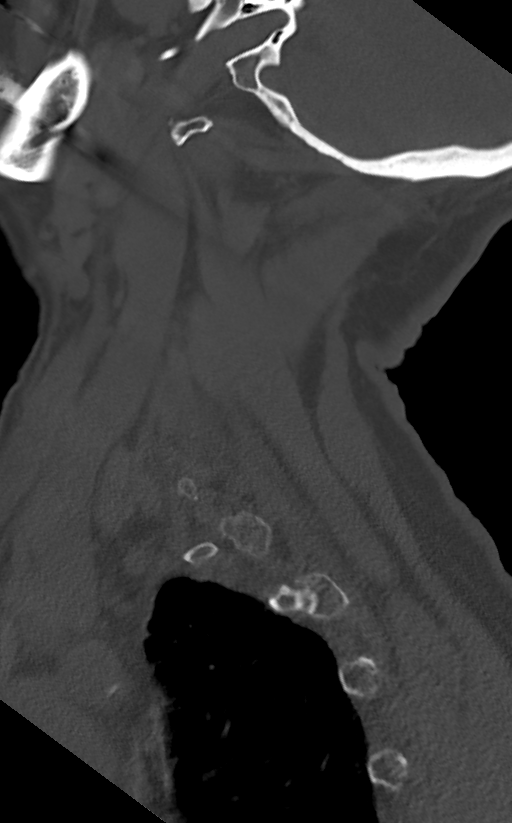
[im 25/61  bone]
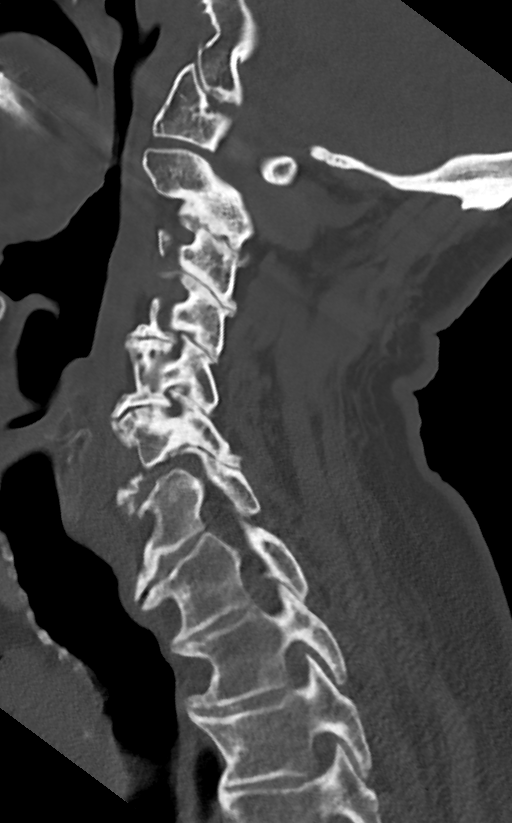
[im 37/61  bone]
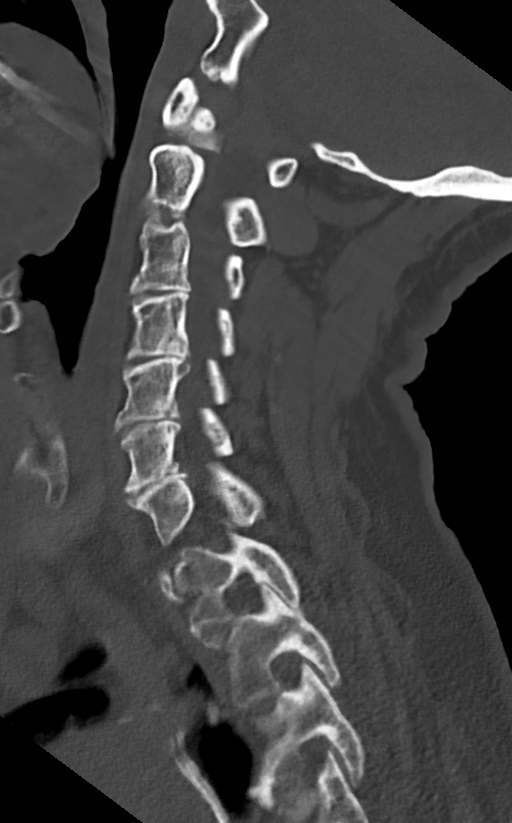
[im 49/61  bone]
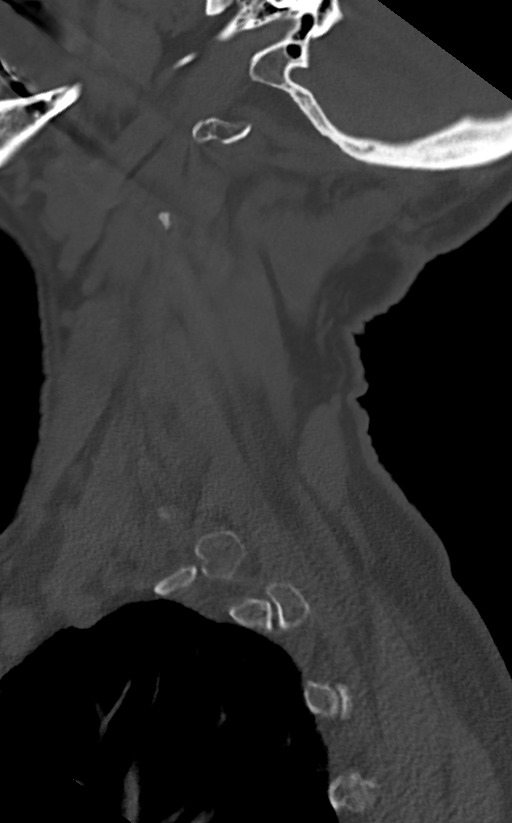

[14 of 33 positions shown; findings below may reference images not displayed]

FINDINGS: CT HEAD FINDINGS

Brain: Brain atrophy noted with chronic white matter microvascular
changes. Remote left parietal infarct with encephalomalacia. No
acute intracranial hemorrhage, new mass lesion, new infarction,
midline shift, herniation, hydrocephalus. No extra-axial fluid
collection. Cisterns are patent. Cerebellar atrophy as well.

Vascular: Intracranial atherosclerosis.  No hyperdense vessel.

Skull: Normal. Negative for fracture or focal lesion.

Sinuses/Orbits: Orbits are symmetric. Minor polypoid mucosal
thickening in the right maxillary sinus. No sinus air-fluid level.

Other: None.

CT CERVICAL SPINE FINDINGS

Alignment: Normal.

Skull base and vertebrae: No acute fracture. No primary bone lesion
or focal pathologic process.

Soft tissues and spinal canal: No prevertebral fluid or swelling. No
visible canal hematoma.

Disc levels: Cervical degenerative spondylosis at all levels with
disc space narrowing, sclerosis and osteophytes. C4-5 and C5-6 are
most severe. Multilevel facet arthropathy, worse on the right. No
subluxation dislocation. Normal prevertebral soft tissues.
Degenerative changes of the C1-2 articulation.

Upper chest: Negative.

Other: None.
IMPRESSION: No acute intracranial abnormality by noncontrast CT. Brain atrophy
and chronic white matter microvascular changes with remote left
parietal infarct noted.

Cervical degenerative spondylosis without acute osseous finding,
fracture or malalignment.

## 2019-11-14 DEATH — deceased
